# Patient Record
Sex: Female | Born: 1955 | Race: White | Hispanic: No | Marital: Married | Smoking: Never smoker
Health system: Southern US, Community
[De-identification: ages and names within clinical notes are randomized; demographics above are authoritative.]

## PROBLEM LIST (undated history)

## (undated) DIAGNOSIS — G8929 Other chronic pain: Secondary | ICD-10-CM

## (undated) DIAGNOSIS — R351 Nocturia: Secondary | ICD-10-CM

## (undated) DIAGNOSIS — F32A Depression, unspecified: Secondary | ICD-10-CM

## (undated) DIAGNOSIS — T8859XA Other complications of anesthesia, initial encounter: Secondary | ICD-10-CM

## (undated) DIAGNOSIS — R350 Frequency of micturition: Secondary | ICD-10-CM

## (undated) DIAGNOSIS — F329 Major depressive disorder, single episode, unspecified: Secondary | ICD-10-CM

## (undated) DIAGNOSIS — G4700 Insomnia, unspecified: Secondary | ICD-10-CM

## (undated) DIAGNOSIS — K219 Gastro-esophageal reflux disease without esophagitis: Secondary | ICD-10-CM

## (undated) DIAGNOSIS — IMO0002 Reserved for concepts with insufficient information to code with codable children: Secondary | ICD-10-CM

## (undated) DIAGNOSIS — M75101 Unspecified rotator cuff tear or rupture of right shoulder, not specified as traumatic: Secondary | ICD-10-CM

## (undated) DIAGNOSIS — R112 Nausea with vomiting, unspecified: Secondary | ICD-10-CM

## (undated) DIAGNOSIS — E1340 Other specified diabetes mellitus with diabetic neuropathy, unspecified: Secondary | ICD-10-CM

## (undated) DIAGNOSIS — E785 Hyperlipidemia, unspecified: Secondary | ICD-10-CM

## (undated) DIAGNOSIS — G40909 Epilepsy, unspecified, not intractable, without status epilepticus: Secondary | ICD-10-CM

## (undated) DIAGNOSIS — M2550 Pain in unspecified joint: Secondary | ICD-10-CM

## (undated) DIAGNOSIS — G5600 Carpal tunnel syndrome, unspecified upper limb: Secondary | ICD-10-CM

## (undated) DIAGNOSIS — K429 Umbilical hernia without obstruction or gangrene: Secondary | ICD-10-CM

## (undated) DIAGNOSIS — R51 Headache: Secondary | ICD-10-CM

## (undated) DIAGNOSIS — M549 Dorsalgia, unspecified: Secondary | ICD-10-CM

## (undated) DIAGNOSIS — M2540 Effusion, unspecified joint: Secondary | ICD-10-CM

## (undated) DIAGNOSIS — F419 Anxiety disorder, unspecified: Secondary | ICD-10-CM

## (undated) DIAGNOSIS — I10 Essential (primary) hypertension: Secondary | ICD-10-CM

## (undated) DIAGNOSIS — R0602 Shortness of breath: Secondary | ICD-10-CM

## (undated) DIAGNOSIS — B379 Candidiasis, unspecified: Secondary | ICD-10-CM

## (undated) DIAGNOSIS — Z9889 Other specified postprocedural states: Secondary | ICD-10-CM

## (undated) DIAGNOSIS — Z87440 Personal history of urinary (tract) infections: Secondary | ICD-10-CM

## (undated) DIAGNOSIS — M1712 Unilateral primary osteoarthritis, left knee: Secondary | ICD-10-CM

## (undated) DIAGNOSIS — E119 Type 2 diabetes mellitus without complications: Secondary | ICD-10-CM

## (undated) DIAGNOSIS — T4145XA Adverse effect of unspecified anesthetic, initial encounter: Secondary | ICD-10-CM

## (undated) HISTORY — DX: Other specified diabetes mellitus with diabetic neuropathy, unspecified: E13.40

## (undated) HISTORY — DX: Epilepsy, unspecified, not intractable, without status epilepticus: G40.909

## (undated) HISTORY — PX: JOINT REPLACEMENT: SHX530

## (undated) HISTORY — PX: TONSILLECTOMY: SUR1361

## (undated) HISTORY — PX: CHOLECYSTECTOMY: SHX55

## (undated) HISTORY — PX: BACK SURGERY: SHX140

## (undated) HISTORY — PX: ABDOMINAL HYSTERECTOMY: SHX81

## (undated) SURGERY — Surgical Case
Anesthesia: *Unknown

---

## 2011-05-04 ENCOUNTER — Other Ambulatory Visit: Payer: Self-pay | Admitting: Orthopedic Surgery

## 2011-05-05 ENCOUNTER — Other Ambulatory Visit: Payer: Self-pay | Admitting: Orthopedic Surgery

## 2011-05-06 ENCOUNTER — Encounter (HOSPITAL_BASED_OUTPATIENT_CLINIC_OR_DEPARTMENT_OTHER): Payer: Self-pay | Admitting: *Deleted

## 2011-05-06 NOTE — Progress Notes (Signed)
Pt lives in [place] Will need ekg-istat-

## 2011-05-09 ENCOUNTER — Encounter (HOSPITAL_BASED_OUTPATIENT_CLINIC_OR_DEPARTMENT_OTHER): Payer: Self-pay | Admitting: Anesthesiology

## 2011-05-09 ENCOUNTER — Ambulatory Visit (HOSPITAL_BASED_OUTPATIENT_CLINIC_OR_DEPARTMENT_OTHER)
Admission: RE | Admit: 2011-05-09 | Discharge: 2011-05-09 | Disposition: A | Discharge status: Home / Self Care | Payer: Medicare Other | Source: Ambulatory Visit | Attending: Orthopedic Surgery | Admitting: Orthopedic Surgery

## 2011-05-09 ENCOUNTER — Ambulatory Visit (HOSPITAL_BASED_OUTPATIENT_CLINIC_OR_DEPARTMENT_OTHER): Payer: Medicare Other | Admitting: Anesthesiology

## 2011-05-09 ENCOUNTER — Encounter (HOSPITAL_BASED_OUTPATIENT_CLINIC_OR_DEPARTMENT_OTHER)
Admission: RE | Disposition: A | Discharge status: Home / Self Care | Payer: Self-pay | Source: Ambulatory Visit | Attending: Orthopedic Surgery

## 2011-05-09 ENCOUNTER — Encounter (HOSPITAL_BASED_OUTPATIENT_CLINIC_OR_DEPARTMENT_OTHER): Payer: Self-pay

## 2011-05-09 ENCOUNTER — Encounter (HOSPITAL_BASED_OUTPATIENT_CLINIC_OR_DEPARTMENT_OTHER): Payer: Self-pay | Admitting: Orthopedic Surgery

## 2011-05-09 DIAGNOSIS — I10 Essential (primary) hypertension: Secondary | ICD-10-CM | POA: Insufficient documentation

## 2011-05-09 DIAGNOSIS — G5600 Carpal tunnel syndrome, unspecified upper limb: Secondary | ICD-10-CM | POA: Insufficient documentation

## 2011-05-09 DIAGNOSIS — E119 Type 2 diabetes mellitus without complications: Secondary | ICD-10-CM | POA: Insufficient documentation

## 2011-05-09 DIAGNOSIS — K219 Gastro-esophageal reflux disease without esophagitis: Secondary | ICD-10-CM | POA: Insufficient documentation

## 2011-05-09 HISTORY — DX: Adverse effect of unspecified anesthetic, initial encounter: T41.45XA

## 2011-05-09 HISTORY — DX: Reserved for concepts with insufficient information to code with codable children: IMO0002

## 2011-05-09 HISTORY — DX: Gastro-esophageal reflux disease without esophagitis: K21.9

## 2011-05-09 HISTORY — DX: Other chronic pain: G89.29

## 2011-05-09 HISTORY — DX: Anxiety disorder, unspecified: F41.9

## 2011-05-09 HISTORY — DX: Other complications of anesthesia, initial encounter: T88.59XA

## 2011-05-09 HISTORY — DX: Carpal tunnel syndrome, unspecified upper limb: G56.00

## 2011-05-09 HISTORY — DX: Depression, unspecified: F32.A

## 2011-05-09 HISTORY — DX: Other specified postprocedural states: Z98.890

## 2011-05-09 HISTORY — PX: CARPAL TUNNEL RELEASE: SHX101

## 2011-05-09 HISTORY — DX: Nausea with vomiting, unspecified: R11.2

## 2011-05-09 HISTORY — DX: Essential (primary) hypertension: I10

## 2011-05-09 HISTORY — DX: Major depressive disorder, single episode, unspecified: F32.9

## 2011-05-09 HISTORY — DX: Dorsalgia, unspecified: M54.9

## 2011-05-09 LAB — POCT I-STAT, CHEM 8
BUN: 13 mg/dL (ref 6–23)
Creatinine, Ser: 0.7 mg/dL (ref 0.50–1.10)
Glucose, Bld: 170 mg/dL — ABNORMAL HIGH (ref 70–99)
Hemoglobin: 13.9 g/dL (ref 12.0–15.0)
Sodium: 139 mEq/L (ref 135–145)
TCO2: 25 mmol/L (ref 0–100)

## 2011-05-09 SURGERY — CARPAL TUNNEL RELEASE
Anesthesia: Monitor Anesthesia Care | Site: Wrist | Laterality: Right | Wound class: Clean

## 2011-05-09 MED ORDER — METOCLOPRAMIDE HCL 5 MG/ML IJ SOLN
INTRAMUSCULAR | Status: DC | PRN
  Administered 2011-05-09: 10 mg via INTRAVENOUS

## 2011-05-09 MED ORDER — CEFAZOLIN SODIUM 1-5 GM-% IV SOLN
1.0000 g | INTRAVENOUS | Status: AC
  Administered 2011-05-09: 1 g via INTRAVENOUS

## 2011-05-09 MED ORDER — LACTATED RINGERS IV SOLN
INTRAVENOUS | Status: DC
  Administered 2011-05-09: 09:00:00 via INTRAVENOUS

## 2011-05-09 MED ORDER — MIDAZOLAM HCL 5 MG/5ML IJ SOLN
INTRAMUSCULAR | Status: DC | PRN
  Administered 2011-05-09 (×2): 1 mg via INTRAVENOUS

## 2011-05-09 MED ORDER — FENTANYL CITRATE 0.05 MG/ML IJ SOLN
INTRAMUSCULAR | Status: DC | PRN
  Administered 2011-05-09 (×2): 50 ug via INTRAVENOUS

## 2011-05-09 MED ORDER — CHLORHEXIDINE GLUCONATE 4 % EX LIQD: 60.0000 mL | Freq: Once | CUTANEOUS | Status: DC

## 2011-05-09 MED ORDER — PROPOFOL 10 MG/ML IV EMUL
INTRAVENOUS | Status: DC | PRN
  Administered 2011-05-09: 100 ug/kg/min via INTRAVENOUS

## 2011-05-09 MED ORDER — HYDROCODONE-ACETAMINOPHEN 5-325 MG PO TABS: ORAL_TABLET | ORAL | Status: DC

## 2011-05-09 MED ORDER — BUPIVACAINE HCL (PF) 0.25 % IJ SOLN
INTRAMUSCULAR | Status: DC | PRN
  Administered 2011-05-09: 10 mL

## 2011-05-09 MED ORDER — ONDANSETRON HCL 4 MG/2ML IJ SOLN
INTRAMUSCULAR | Status: DC | PRN
  Administered 2011-05-09: 4 mg via INTRAVENOUS

## 2011-05-09 SURGICAL SUPPLY — 33 items
BANDAGE ELASTIC 3 VELCRO ST LF (GAUZE/BANDAGES/DRESSINGS) ×2 IMPLANT
BANDAGE GAUZE ELAST BULKY 4 IN (GAUZE/BANDAGES/DRESSINGS) ×2 IMPLANT
BLADE MINI RND TIP GREEN BEAV (BLADE) IMPLANT
BLADE SURG 15 STRL LF DISP TIS (BLADE) ×2 IMPLANT
BLADE SURG 15 STRL SS (BLADE) ×2
BNDG ESMARK 4X9 LF (GAUZE/BANDAGES/DRESSINGS) IMPLANT
CHLORAPREP W/TINT 26ML (MISCELLANEOUS) ×2 IMPLANT
CLOTH BEACON ORANGE TIMEOUT ST (SAFETY) ×2 IMPLANT
CORDS BIPOLAR (ELECTRODE) ×2 IMPLANT
COVER MAYO STAND STRL (DRAPES) ×2 IMPLANT
COVER TABLE BACK 60X90 (DRAPES) ×2 IMPLANT
CUFF TOURNIQUET SINGLE 18IN (TOURNIQUET CUFF) ×2 IMPLANT
DRAPE EXTREMITY T 121X128X90 (DRAPE) ×2 IMPLANT
DRAPE SURG 17X23 STRL (DRAPES) ×2 IMPLANT
DRSG PAD ABDOMINAL 8X10 ST (GAUZE/BANDAGES/DRESSINGS) ×2 IMPLANT
GAUZE XEROFORM 1X8 LF (GAUZE/BANDAGES/DRESSINGS) ×2 IMPLANT
GLOVE BIO SURGEON STRL SZ 6.5 (GLOVE) ×2 IMPLANT
GLOVE BIO SURGEON STRL SZ7.5 (GLOVE) ×2 IMPLANT
GOWN PREVENTION PLUS XLARGE (GOWN DISPOSABLE) ×4 IMPLANT
GOWN STRL REIN XL XLG (GOWN DISPOSABLE) IMPLANT
NEEDLE HYPO 25X1 1.5 SAFETY (NEEDLE) ×2 IMPLANT
NS IRRIG 1000ML POUR BTL (IV SOLUTION) ×2 IMPLANT
PACK BASIN DAY SURGERY FS (CUSTOM PROCEDURE TRAY) ×2 IMPLANT
PADDING CAST ABS 4INX4YD NS (CAST SUPPLIES) ×1
PADDING CAST ABS COTTON 4X4 ST (CAST SUPPLIES) ×1 IMPLANT
SPONGE GAUZE 4X4 12PLY (GAUZE/BANDAGES/DRESSINGS) ×2 IMPLANT
STOCKINETTE 4X48 STRL (DRAPES) ×2 IMPLANT
SUT ETHILON 4 0 PS 2 18 (SUTURE) ×2 IMPLANT
SYR BULB 3OZ (MISCELLANEOUS) ×2 IMPLANT
SYR CONTROL 10ML LL (SYRINGE) ×2 IMPLANT
TOWEL OR 17X24 6PK STRL BLUE (TOWEL DISPOSABLE) ×4 IMPLANT
UNDERPAD 30X30 INCONTINENT (UNDERPADS AND DIAPERS) ×2 IMPLANT
WATER STERILE IRR 1000ML POUR (IV SOLUTION) ×2 IMPLANT

## 2011-05-09 NOTE — Brief Op Note (Signed)
05/09/2011  9:35 AM  PATIENT:  Suzy Bouchard  56 y.o. female  PRE-OPERATIVE DIAGNOSIS:  right carpal tunnel syndrome  POST-OPERATIVE DIAGNOSIS:  same  PROCEDURE:  Procedure(s) (LRB): CARPAL TUNNEL RELEASE (Right)  SURGEON:  Surgeon(s) and Role:    * Tami Ribas, MD - Primary  PHYSICIAN ASSISTANT:   ASSISTANTS: none   ANESTHESIA:   bier block  EBL:  Total I/O In: 100 [I.V.:100] Out: -   BLOOD ADMINISTERED:none  DRAINS: none   LOCAL MEDICATIONS USED:  MARCAINE     SPECIMEN:  No Specimen  DISPOSITION OF SPECIMEN:  N/A  COUNTS:  YES  TOURNIQUET:   Total Tourniquet Time Documented: Forearm (Right) - 31 minutes  DICTATION: .Other Dictation: Dictation Number [id number]  PLAN OF CARE: Discharge to home after PACU  PATIENT DISPOSITION:  PACU - hemodynamically stable.

## 2011-05-09 NOTE — Op Note (Signed)
Michelle Crane, Michelle Crane               ACCOUNT NO.:  [id number]  MEDICAL RECORD NO.:  [id number]  LOCATION:                                 FACILITY:  PHYSICIAN:  Betha Loa, MD             DATE OF BIRTH:  DATE OF PROCEDURE:  05/09/2011 DATE OF DISCHARGE:                              OPERATIVE REPORT   PREOPERATIVE DIAGNOSIS:  Right carpal tunnel syndrome.  POSTOPERATIVE DIAGNOSIS:  Right carpal tunnel syndrome.  PROCEDURE:  Right carpal tunnel release.  SURGEON:  Betha Loa, MD  ASSISTANTS:  None.  ANESTHESIA:  Bier block.  INTRAVENOUS FLUIDS:  Per anesthesia flow sheet.  ESTIMATED BLOOD LOSS:  Minimal.  COMPLICATIONS:  None.  SPECIMENS:  None.  TOURNIQUET TIME:  31 minutes.  DISPOSITION:  Stable to PACU.  INDICATIONS:  Ms. Negron is a 56 year old female who has had carpal tunnel syndrome in bilateral upper extremities.  She has positive nerve conduction studies.  She has positive Tinel's and Phalen's test of median nerve over the carpal tunnel.  It wakes her at night.  She wishes to have a release of transverse carpal ligament to manage the problem. Risks, benefits, and alternatives of surgery were discussed including risk of blood loss, infection, damage to nerves, vessels, tendons, ligaments, and bone, failure of surgery, need for additional surgery, complications with wound healing, continued pain, continued carpal tunnel syndrome.  She wished understanding these risks and elected to proceed.  OPERATIVE COURSE:  After being identified preoperatively by myself, the Patient and I agreed upon procedure and site of procedure.  Surgical site was marked.  The risks, benefits, and alternatives of surgery were reviewed and she wished to proceed.  Surgical consent had been signed. She was given 1 g of IV Ancef as preoperative antibiotic prophylaxis. She was transferred to the operating room and placed on the operating table in supine position with right upper  extremity on an arm board. Bier block anesthesia was induced by anesthesiologist.  The right upper extremity was prepped and draped in a normal sterile orthopedic fashion. A surgical pause was performed between surgeons, anesthesia, and operating staff and all were agreement as to the patient procedure and site of procedure.  An incision was made centered over the transverse carpal ligament.  This was carried into the subcutaneous tissues by spreading technique.  The transverse carpal ligament was incised sharply with a knife.  Care was taken to ensure complete release of the transverse carpal ligament distally.  It was then incised in a proximal direction.  The distal aspect of the volar antebrachial fascia was split as well.  Finger was placed in the wound to ensure complete release of the transverse carpal ligament which was the case.  The nerve was inspected.  It was flattened.  The motor branch was identified and was intact.  The wound was copiously irrigated with sterile saline.  Skin was closed with 4-0 nylon in a horizontal mattress fashion.  The wound was injected with 10 mL of 0.25% plain Marcaine to aid in postoperative analgesia.  It was then dressed with sterile Xeroform, 4 x 4's, and ABD and wrapped with Kerlix  and Ace bandage.  Tourniquet was deflated at 31 minutes.  The fingertips were pink with brisk capillary refill after deflation of tourniquet.  Operative drapes were broken down and the patient was awoken from anesthesia safely.  She was transferred back to stretcher and taken to PACU in stable condition.  I will see her back in the office in 1 week for postoperative followup.  I will give her Norco 5/325 one to two p.o. q.6 hours p.r.n. pain, dispensed #40.     Betha Loa, MD     KK/MEDQ  D:  05/09/2011  T:  05/09/2011  Job:  [id number]

## 2011-05-09 NOTE — H&P (Signed)
Michelle Crane is an 56 y.o. female.   Chief Complaint: carpal tunnel syndrome HPI: 56 yo rhd female with bilateral carpal tunnel syndrome.  Nocturnal symptoms every night.  Positive nerve conduction studies.  She wishes to have carpal tunnel release.  Past Medical History  Diagnosis Date  . Complication of anesthesia   . PONV (postoperative nausea and vomiting)   . DDD (degenerative disc disease)   . Diabetes mellitus   . Hypertension   . Anxiety   . Depression   . Chronic back pain   . GERD (gastroesophageal reflux disease)   . Carpal tunnel syndrome     Past Surgical History  Procedure Date  . Tonsillectomy   . Cholecystectomy   . Abdominal hysterectomy   . Back surgery [id number]    lumb  . Joint replacement 2011    rt total knee    History reviewed. No pertinent family history. Social History:  reports that she has never smoked. She does not have any smokeless tobacco history on file. She reports that she does not drink alcohol or use illicit drugs.  Allergies: No Known Allergies  Medications Prior to Admission  Medication Dose Route Frequency Provider Last Rate Last Dose  . ceFAZolin (ANCEF) IVPB 1 g/50 mL premix  1 g Intravenous 60 min Pre-Op Tami Ribas, MD      . chlorhexidine (HIBICLENS) 4 % liquid 4 application  60 mL Topical Once Tami Ribas, MD      . lactated ringers infusion   Intravenous Continuous Hart Robinsons, MD       Medications Prior to Admission  Medication Sig Dispense Refill  . ALPRAZolam (XANAX) 1 MG tablet Take 1 mg by mouth 3 (three) times daily as needed.      Marland Kitchen glimepiride (AMARYL) 2 MG tablet Take 2 mg by mouth daily before breakfast.      . HYDROcodone-acetaminophen (NORCO) 10-325 MG per tablet Take 1 tablet by mouth every 6 (six) hours as needed.      . metoprolol succinate (TOPROL-XL) 50 MG 24 hr tablet Take 50 mg by mouth daily. Take with or immediately following a meal.        Results for orders placed during the hospital  encounter of 05/09/11 (from the past 48 hour(s))  POCT I-STAT, CHEM 8     Status: Abnormal   Collection Time   05/09/11  8:29 AM      Component Value Range Comment   Sodium 139  135 - 145 (mEq/L)    Potassium 3.9  3.5 - 5.1 (mEq/L)    Chloride 106  96 - 112 (mEq/L)    BUN 13  6 - 23 (mg/dL)    Creatinine, Ser 5.62  0.50 - 1.10 (mg/dL)    Glucose, Bld 130 (*) 70 - 99 (mg/dL)    Calcium, Ion 8.65 (*) 1.12 - 1.32 (mmol/L)    TCO2 25  0 - 100 (mmol/L)    Hemoglobin 13.9  12.0 - 15.0 (g/dL)    HCT 78.4  69.6 - 29.5 (%)     No results found.   A comprehensive review of systems was negative except for: Eyes: positive for contacts/glasses Neurological: positive for gait problems Behavioral/Psych: positive for depression  Blood pressure 153/93, pulse 83, temperature 98.1 F (36.7 C), temperature source Oral, resp. rate 20, height 5\' 1"  (1.549 m), weight 74.844 kg (165 lb), SpO2 97.00%.  General appearance: alert, cooperative and appears stated age Head: Normocephalic, without obvious abnormality, atraumatic Neck: supple,  symmetrical, trachea midline Resp: clear to auscultation bilaterally Cardio: regular rate and rhythm GI: soft, non-tender; bowel sounds normal; no masses,  no organomegaly Extremities: light touch sensation and capillary refill intact all digits.  +epl/fpl/io.  positive tinels, phalens, durkins at median nerve at carpal tunnel. Pulses: 2+ and symmetric Skin: Skin color, texture, turgor normal. No rashes or lesions Neurologic: Grossly normal Incision/Wound: na  Assessment/Plan Bilateral carpal tunnel syndrome.  Discussed operative and non operative treatment options.  Patient wishes to have operative treatment.  Risks, benefits, and alternatives of surgery were discussed and the patient agrees with the plan of care.   Michelle Crane R 05/09/2011, 8:40 AM

## 2011-05-09 NOTE — Discharge Instructions (Addendum)

## 2011-05-09 NOTE — Transfer of Care (Signed)
Immediate Anesthesia Transfer of Care Note  Patient: Michelle Crane  Procedure(s) Performed: Procedure(s) (LRB): CARPAL TUNNEL RELEASE (Right)  Patient Location: PACU  Anesthesia Type: MAC and Bier block  Level of Consciousness: awake and alert   Airway & Oxygen Therapy: Patient Spontanous Breathing and Patient connected to face mask oxygen  Post-op Assessment: Report given to PACU RN and Post -op Vital signs reviewed and stable  Post vital signs: Reviewed and stable  Complications: No apparent anesthesia complications

## 2011-05-09 NOTE — Anesthesia Preprocedure Evaluation (Signed)
Anesthesia Evaluation  Patient identified by MRN, date of birth, ID band Patient awake    Reviewed: Allergy & Precautions, H&P , NPO status , Patient's Chart, lab work & pertinent test results, reviewed documented beta blocker date and time   History of Anesthesia Complications (+) PONVNegative for: history of anesthetic complications  Airway Mallampati: II TM Distance: >3 FB Neck ROM: Full    Dental No notable dental hx. (+) Dental Advisory Given and Teeth Intact   Pulmonary neg pulmonary ROS,  breath sounds clear to auscultation  Pulmonary exam normal       Cardiovascular hypertension, Pt. on medications and Pt. on home beta blockers negative cardio ROS  Rhythm:Regular Rate:Normal     Neuro/Psych negative neurological ROS     GI/Hepatic Neg liver ROS, GERD-  Controlled,  Endo/Other  Diabetes mellitus-, Type 2, Oral Hypoglycemic AgentsMorbid obesity  Renal/GU negative Renal ROS     Musculoskeletal   Abdominal (+) + obese,   Peds  Hematology   Anesthesia Other Findings   Reproductive/Obstetrics                           Anesthesia Physical Anesthesia Plan  ASA: II  Anesthesia Plan: Bier Block and MAC   Post-op Pain Management:    Induction:   Airway Management Planned: Simple Face Mask  Additional Equipment:   Intra-op Plan:   Post-operative Plan:   Informed Consent: I have reviewed the patients History and Physical, chart, labs and discussed the procedure including the risks, benefits and alternatives for the proposed anesthesia with the patient or authorized representative who has indicated his/her understanding and acceptance.   Dental advisory given  Plan Discussed with: CRNA and Surgeon  Anesthesia Plan Comments: (Plan routine monitors, IV regional Lidocaine)        Anesthesia Quick Evaluation

## 2011-05-09 NOTE — Anesthesia Postprocedure Evaluation (Signed)
  Anesthesia Post-op Note  Patient: Michelle Crane  Procedure(s) Performed: Procedure(s) (LRB): CARPAL TUNNEL RELEASE (Right)  Patient Location: PACU  Anesthesia Type: Bier block  Level of Consciousness: awake, alert  and oriented  Airway and Oxygen Therapy: Patient Spontanous Breathing  Post-op Pain: none  Post-op Assessment: Post-op Vital signs reviewed, Patient's Cardiovascular Status Stable, Respiratory Function Stable, Patent Airway, No signs of Nausea or vomiting and Pain level controlled  Post-op Vital Signs: Reviewed and stable  Complications: No apparent anesthesia complications

## 2011-05-09 NOTE — Op Note (Signed)
Dictation [id number]

## 2011-05-10 ENCOUNTER — Encounter (HOSPITAL_BASED_OUTPATIENT_CLINIC_OR_DEPARTMENT_OTHER): Payer: Self-pay | Admitting: Orthopedic Surgery

## 2011-11-07 ENCOUNTER — Other Ambulatory Visit: Payer: Self-pay | Admitting: Neurosurgery

## 2011-11-07 DIAGNOSIS — M4120 Other idiopathic scoliosis, site unspecified: Secondary | ICD-10-CM

## 2011-11-28 ENCOUNTER — Ambulatory Visit
Admission: RE | Admit: 2011-11-28 | Discharge: 2011-11-28 | Disposition: A | Discharge status: Home / Self Care | Payer: Medicare Other | Source: Ambulatory Visit | Attending: Neurosurgery | Admitting: Neurosurgery

## 2011-11-28 DIAGNOSIS — M4120 Other idiopathic scoliosis, site unspecified: Secondary | ICD-10-CM

## 2011-11-28 MED ORDER — GADOBENATE DIMEGLUMINE 529 MG/ML IV SOLN
15.0000 mL | Freq: Once | INTRAVENOUS | Status: AC | PRN
  Administered 2011-11-28: 15 mL via INTRAVENOUS

## 2011-11-29 ENCOUNTER — Other Ambulatory Visit: Payer: Self-pay | Admitting: Neurosurgery

## 2011-12-14 ENCOUNTER — Encounter (HOSPITAL_COMMUNITY): Payer: Self-pay | Admitting: Pharmacy Technician

## 2011-12-19 ENCOUNTER — Encounter (HOSPITAL_COMMUNITY): Payer: Self-pay

## 2011-12-19 ENCOUNTER — Encounter (HOSPITAL_COMMUNITY)
Admission: RE | Admit: 2011-12-19 | Discharge: 2011-12-19 | Disposition: A | Discharge status: Home / Self Care | Payer: Medicare Other | Source: Ambulatory Visit | Attending: Neurosurgery | Admitting: Neurosurgery

## 2011-12-19 ENCOUNTER — Ambulatory Visit (HOSPITAL_COMMUNITY)
Admission: RE | Admit: 2011-12-19 | Discharge: 2011-12-19 | Disposition: A | Discharge status: Home / Self Care | Payer: Medicare Other | Source: Ambulatory Visit | Attending: Anesthesiology | Admitting: Anesthesiology

## 2011-12-19 DIAGNOSIS — M519 Unspecified thoracic, thoracolumbar and lumbosacral intervertebral disc disorder: Secondary | ICD-10-CM | POA: Insufficient documentation

## 2011-12-19 DIAGNOSIS — Z01811 Encounter for preprocedural respiratory examination: Secondary | ICD-10-CM | POA: Insufficient documentation

## 2011-12-19 HISTORY — DX: Frequency of micturition: R35.0

## 2011-12-19 HISTORY — DX: Personal history of urinary (tract) infections: Z87.440

## 2011-12-19 HISTORY — DX: Nocturia: R35.1

## 2011-12-19 HISTORY — DX: Pain in unspecified joint: M25.50

## 2011-12-19 HISTORY — DX: Candidiasis, unspecified: B37.9

## 2011-12-19 HISTORY — DX: Insomnia, unspecified: G47.00

## 2011-12-19 HISTORY — DX: Umbilical hernia without obstruction or gangrene: K42.9

## 2011-12-19 HISTORY — DX: Headache: R51

## 2011-12-19 HISTORY — DX: Shortness of breath: R06.02

## 2011-12-19 HISTORY — DX: Effusion, unspecified joint: M25.40

## 2011-12-19 HISTORY — DX: Hyperlipidemia, unspecified: E78.5

## 2011-12-19 LAB — CBC
Hemoglobin: 14.9 g/dL (ref 12.0–15.0)
MCH: 30 pg (ref 26.0–34.0)
MCHC: 33.5 g/dL (ref 30.0–36.0)
MCV: 89.5 fL (ref 78.0–100.0)
Platelets: 205 10*3/uL (ref 150–400)
RBC: 4.97 MIL/uL (ref 3.87–5.11)

## 2011-12-19 LAB — TYPE AND SCREEN
ABO/RH(D): A POS
Antibody Screen: NEGATIVE

## 2011-12-19 LAB — BASIC METABOLIC PANEL
BUN: 20 mg/dL (ref 6–23)
CO2: 30 mEq/L (ref 19–32)
Calcium: 9.8 mg/dL (ref 8.4–10.5)
Glucose, Bld: 110 mg/dL — ABNORMAL HIGH (ref 70–99)
Sodium: 139 mEq/L (ref 135–145)

## 2011-12-19 NOTE — Progress Notes (Signed)
Pt doesn't have a cardiologist  Denies ever having an echo/stress test/heart cath  Medical MD-Dr.Caroline Seep in [place] at [facility]   Denies ekg or cxr being done within the past yr

## 2011-12-19 NOTE — Pre-Procedure Instructions (Signed)
20 Mylisa Zimny  12/19/2011   Your procedure is scheduled on:  Tues, Dec 3 @ 7:30 AM  Report to [facility] at 5:30 AM.  Call this number if you have problems the morning of surgery: [phone]   Remember:   Do not eat food:After Midnight.    Take these medicines the morning of surgery with A SIP OF WATER: Xanax(Alprazolam) and Metoprolol(Lopressor)   Do not wear jewelry, make-up or nail polish.  Do not wear lotions, powders, or perfumes. You may wear deodorant.  Do not shave 48 hours prior to surgery.   Do not bring valuables to the hospital.  Contacts, dentures or bridgework may not be worn into surgery.  Leave suitcase in the car. After surgery it may be brought to your room.  For patients admitted to the hospital, checkout time is 11:00 AM the day of discharge.   Patients discharged the day of surgery will not be allowed to drive home.    Special Instructions: Shower using CHG 2 nights before surgery and the night before surgery.  If you shower the day of surgery use CHG.  Use special wash - you have one bottle of CHG for all showers.  You should use approximately 1/3 of the bottle for each shower.   Please read over the following fact sheets that you were given: Pain Booklet, Coughing and Deep Breathing, Blood Transfusion Information, MRSA Information and Surgical Site Infection Prevention

## 2011-12-26 MED ORDER — CEFAZOLIN SODIUM-DEXTROSE 2-3 GM-% IV SOLR
2.0000 g | INTRAVENOUS | Status: AC
  Administered 2011-12-27: 2 g via INTRAVENOUS
  Filled 2011-12-26: qty 50

## 2011-12-26 NOTE — H&P (Signed)
Michelle Crane #[id number] DOB: [birth year]   11/28/2011:  Michelle Crane comes back today to review her MRI of her lumbar spine and this shows that she has scoliosis, spondylolisthesis, and severe spinal stenosis affecting L2-3, L3-4, L4-5, and L5-S1 levels.  She has had progression of scoliosis and spondylosis with foraminal stenosis compared to a previous MRI.    In light of these findings and her pain, I have recommended she undergo surgery and she wishes to do so, and this will be scheduled for 12/27/2011.  It will consist of a re-do decompression L2 through S1 levels with fusion. She was fitted for an LSO brace. We went over in great detail the risks and benefits of surgery and she wishes to proceed.   I went over the surgical procedure with the patient in great detail today.  I went over the diagnostic studies, as well as surgical models.  We discussed the potential risks and benefits of the surgery, as well as expected hospital course.  The patient would generally wear a brace for three months after surgery.  The risks include, but are not limited to, bleeding, possible need for transfusion, infection, damage to nerves and vessels, risks of anesthesia, dural tear, injury to lumbar nerve roots causing either temporary or permanent leg pain, numbness or weakness, malpositioning of instrumentation, fusion failure, failure to relieve pain, back pain after surgery, recurrent disc herniation, worsening of pain and adjacent degeneration after a spinal fusion.  Also, the potential for the need for further surgery in the event of incomplete fusion.  We also discussed the risks of injury to abdominal structures including bowel, iliac artery or vein injury.           Michelle Crane. Venetia Maxon, M.D./[place]   NEUROSURGICAL CONSULTATION  Michelle Crane  #[id number] DOB:  09-13-1955  November 07, 2011  HISTORY:     Michelle Crane is a 56 year old disabled woman with back and leg pain who complains of pain both of her legs into the  feet and both buttocks.  She describes it 5/10 in severity at its best and 10/10 at its worse.  She notes numbness to both legs.  She says this has been doing on for the last three years.  She notes spasms into both legs.  She says she has to lie down to get some relief.  She has had injections by Dr. Rhea Pink without significant help in 2011.  She has been taking Hydrocodone 10/325 four times daily, Trazodone 50 mg. 2 p.o. q.h.s.    She saw Dr. Eulah Pont in [place] last year and surgery was planned but the physician unexpected left the practice.  She says she is using a walker and cannot stand long.  She has pain in both legs and down into the legs.    REVIEW OF SYSTEMS:   A detailed Review of Systems sheet was reviewed with the patient.  Pertinent positives include: Constitutional-night sweats; Eyes-wears glasses, ENTM-balance disturbance; Cardiovascular-high blood pressure, high cholesterol, leg pain while walking; Musculoskeletal-arm weakness leg weakness, back pain, leg pain, joint pain or swelling, arthritis; Neurological-trouble with coordination of legs; Psychiatric-anxiety; Endocrine-type II diabetes, hormone problems. All other systems are negative; this includes Respiratory, Gastrointestinal, Genitourinary, Integumentary & Breast, Hematologic/Lymphatic, and Allergic/Immunologic.    PAST MEDICAL HISTORY:      Current Medical Conditions:    She has a history of hypertension, type II diabetes, and elevated cholesterol.       Prior Operations and Hospitalizations:   She has had  previous surgery by Dr. Krista Blue in 1992 and 1995 both for herniated lumbar discs.  She has had right total knee replacement in 2011, left carpal tunnel release in 04/2011 and bladder tack.      Medications and Allergies:   Hydrocodone 10/325 q.i.d., Metoprolol 50 mg. q.d., Alprazolam 1 mg. t.i.d., Glimepiride 4 mg. b.i.d. and Trazodone 150 mg. up to 3 tabs at bedtime.      Height and Weight:     5'1" tall, and 164  pounds.  BMI is 31.0.    SOCIAL HISTORY:    She denies tobacco, alcohol or drug use.    DIAGNOSTIC STUDIES:   X-rays were obtained in the office today and demonstrate levoconvexed scoliosis of the lumbar spine with mobile spondylolisthesis of L4 on L5.  On neutral alignment, the degree of listhesis is 13 mm which increases to 15 mm on flexion and decreases to 10.8 mm on extension.   MRI of the lumbar spine 12/04/2008 was reviewed with the patient in the office today which demonstrates mild scoliosis and grade I anterolisthesis of L4 on L5 with disc protrusions at L3-4, 4-5 and S5-S1 levels with foraminal stenosis right greater than left at L4-5 and L5-S1 level sand left greater than right at L3-4 level.  There is the high likelihood of encroachment of the nerve particularly L5 nerve roots at L5-S1.    PHYSICAL EXAMINATION:      General Appearance:   Michelle Crane is a pleasant and cooperative and in no acute distress.      Blood Pressure, Pulse:     156/96, heart rate 80 and regular, and respirations 16.    HEENT - normocephalic, atraumatic.  The pupils are equal, round and reactive to light.  The extraocular muscles are intact.  Sclerae - white.  Conjunctiva - pink.  Oropharynx benign.  Uvula midline.     Neck - there are no masses, meningismus, deformities, tracheal deviation, jugular vein distention or carotid bruits.  There is normal cervical range of motion.  Spurlings' test is negative without reproducible radicular pain turning the patient's head to either side.  Lhermitte's sign is not present with axial compression.      Respiratory - there is normal respiratory effort with good intercostal function.  Lungs are clear to auscultation.  There are no rales, rhonchi or wheezes.      Cardiovascular - the heart has regular rate and rhythm to auscultation.  No murmurs are appreciated.  There is no extremity edema, cyanosis or clubbing.  There are palpable pedal pulses.      Abdomen - obese,  soft, nontender, no hepatosplenomegaly appreciated or masses.  There are active bowel sounds.  No guarding or rebound.      Musculoskeletal Examination - she walks with a flexed attitude and is hobbling secondary to left knee arthritis, and bilateral lower extremity pain.  She says she cannot stand for long.  She has pain in both buttocks to both legs.  She has a positive straight leg raise bilaterally at 45 degrees.    NEUROLOGICAL EXAMINATION:  The patient is oriented to time, person and place and has good recall of both recent and remote memory with normal attention span and concentration.  The patient speaks with clear and fluent speech and exhibits normal language function and appropriate fund of knowledge.      Cranial Nerve Examination - pupils are equal, round and reactive to light.  Extraocular movements are full.  Visual fields are full to confrontational  testing.  Facial sensation and facial movement are  symmetric and intact.  Hearing is intact to finger rub.  Palate is upgoing.  Shoulder shrug is symmetric.  Tongue protrudes in the midline.      Motor Examination - motor strength is 5/5 in the bilateral deltoids, biceps, triceps, handgrips, wrist extensors, interosseous.  In the lower extremities motor strength is 5/5 in hip flexion, extension, quadriceps, hamstrings, plantar flexion, dorsiflexion and extensor hallucis longus.      Sensory Examination - normal to light touch and pinprick sensation in the upper and lower extremities with the exception of decreased pin sensation in the left L5 and L4 distributions.     Deep Tendon Reflexes - 2 in the biceps, triceps, and brachioradialis, 2 in the knees, 2 in the ankles.  The great toes are downgoing to plantar stimulation.  No pathologic reflexes.       Cerebellar Examination - normal coordination in upper and lower extremities and normal rapid alternating movements.  Romberg test is negative.    IMPRESSION AND RECOMMENDATIONS: Liadan Guizar is a 56 year old woman with low back and bilateral lower extremity pain.  She has significant spondylolisthesis of L4 on L5 with multilevel scoliosis and foraminal stenosis.  She has not had any recent imaging studies of her lumbar spine.  I would like for her to get an MRI of her lumbar spine and I will make further recommendations after that study has been doing.  I do think that she will likely require surgical intervention given the severity of her complaint and the longstanding nature thereof.    [facility]    Michelle Crane. Venetia Maxon, M.D.

## 2011-12-27 ENCOUNTER — Encounter (HOSPITAL_COMMUNITY): Payer: Self-pay | Admitting: *Deleted

## 2011-12-27 ENCOUNTER — Encounter (HOSPITAL_COMMUNITY)
Admission: RE | Disposition: A | Discharge status: Home / Self Care | Payer: Self-pay | Source: Ambulatory Visit | Attending: Neurosurgery

## 2011-12-27 ENCOUNTER — Inpatient Hospital Stay (HOSPITAL_COMMUNITY): Payer: Medicare Other

## 2011-12-27 ENCOUNTER — Encounter (HOSPITAL_COMMUNITY): Payer: Self-pay | Admitting: Anesthesiology

## 2011-12-27 ENCOUNTER — Inpatient Hospital Stay (HOSPITAL_COMMUNITY)
Admission: RE | Admit: 2011-12-27 | Discharge: 2011-12-29 | DRG: 458 | Disposition: A | Discharge status: Home / Self Care | Payer: Medicare Other | Source: Ambulatory Visit | Attending: Neurosurgery | Admitting: Neurosurgery

## 2011-12-27 ENCOUNTER — Inpatient Hospital Stay (HOSPITAL_COMMUNITY): Payer: Medicare Other | Admitting: Anesthesiology

## 2011-12-27 DIAGNOSIS — F411 Generalized anxiety disorder: Secondary | ICD-10-CM | POA: Diagnosis present

## 2011-12-27 DIAGNOSIS — I10 Essential (primary) hypertension: Secondary | ICD-10-CM | POA: Diagnosis present

## 2011-12-27 DIAGNOSIS — E119 Type 2 diabetes mellitus without complications: Secondary | ICD-10-CM | POA: Diagnosis present

## 2011-12-27 DIAGNOSIS — M4120 Other idiopathic scoliosis, site unspecified: Principal | ICD-10-CM | POA: Diagnosis present

## 2011-12-27 DIAGNOSIS — M5126 Other intervertebral disc displacement, lumbar region: Secondary | ICD-10-CM | POA: Diagnosis present

## 2011-12-27 DIAGNOSIS — Q762 Congenital spondylolisthesis: Secondary | ICD-10-CM

## 2011-12-27 DIAGNOSIS — M47817 Spondylosis without myelopathy or radiculopathy, lumbosacral region: Secondary | ICD-10-CM | POA: Diagnosis present

## 2011-12-27 DIAGNOSIS — Z96659 Presence of unspecified artificial knee joint: Secondary | ICD-10-CM

## 2011-12-27 DIAGNOSIS — Z79899 Other long term (current) drug therapy: Secondary | ICD-10-CM

## 2011-12-27 DIAGNOSIS — Z23 Encounter for immunization: Secondary | ICD-10-CM

## 2011-12-27 DIAGNOSIS — E7800 Pure hypercholesterolemia, unspecified: Secondary | ICD-10-CM | POA: Diagnosis present

## 2011-12-27 HISTORY — PX: POSTERIOR LUMBAR FUSION 4 LEVEL: SHX6037

## 2011-12-27 LAB — GLUCOSE, CAPILLARY
Glucose-Capillary: 121 mg/dL — ABNORMAL HIGH (ref 70–99)
Glucose-Capillary: 136 mg/dL — ABNORMAL HIGH (ref 70–99)

## 2011-12-27 SURGERY — POSTERIOR LUMBAR FUSION 4 LEVEL
Anesthesia: General | Site: Back | Laterality: Bilateral | Wound class: Clean

## 2011-12-27 MED ORDER — PHENYLEPHRINE HCL 10 MG/ML IJ SOLN
INTRAMUSCULAR | Status: DC | PRN
  Administered 2011-12-27 (×2): 40 ug via INTRAVENOUS

## 2011-12-27 MED ORDER — MEPERIDINE HCL 25 MG/ML IJ SOLN: 6.2500 mg | INTRAMUSCULAR | Status: DC | PRN

## 2011-12-27 MED ORDER — HYDROCODONE-ACETAMINOPHEN 5-325 MG PO TABS
1.0000 | ORAL_TABLET | ORAL | Status: DC | PRN
  Administered 2011-12-28: 2 via ORAL
  Administered 2011-12-28: 1 via ORAL
  Administered 2011-12-28 – 2011-12-29 (×3): 2 via ORAL
  Filled 2011-12-27 (×4): qty 2
  Filled 2011-12-27: qty 1

## 2011-12-27 MED ORDER — OXYCODONE-ACETAMINOPHEN 5-325 MG PO TABS
1.0000 | ORAL_TABLET | ORAL | Status: DC | PRN
  Administered 2011-12-28: 1 via ORAL
  Administered 2011-12-28 – 2011-12-29 (×3): 2 via ORAL
  Filled 2011-12-27: qty 1
  Filled 2011-12-27 (×3): qty 2
  Filled 2011-12-27: qty 1
  Filled 2011-12-27: qty 2

## 2011-12-27 MED ORDER — BUPIVACAINE HCL (PF) 0.5 % IJ SOLN
INTRAMUSCULAR | Status: DC | PRN
  Administered 2011-12-27: 7.5 mL

## 2011-12-27 MED ORDER — LIDOCAINE HCL (CARDIAC) 20 MG/ML IV SOLN
INTRAVENOUS | Status: DC | PRN
  Administered 2011-12-27: 80 mg via INTRAVENOUS

## 2011-12-27 MED ORDER — SODIUM CHLORIDE 0.9 % IJ SOLN: 9.0000 mL | INTRAMUSCULAR | Status: DC | PRN

## 2011-12-27 MED ORDER — ACETAMINOPHEN 325 MG PO TABS: 650.0000 mg | ORAL_TABLET | ORAL | Status: DC | PRN

## 2011-12-27 MED ORDER — 0.9 % SODIUM CHLORIDE (POUR BTL) OPTIME
TOPICAL | Status: DC | PRN
  Administered 2011-12-27 (×2): 1000 mL

## 2011-12-27 MED ORDER — MORPHINE SULFATE (PF) 1 MG/ML IV SOLN
INTRAVENOUS | Status: AC
  Filled 2011-12-27: qty 25

## 2011-12-27 MED ORDER — SODIUM CHLORIDE 0.9 % IJ SOLN
3.0000 mL | Freq: Two times a day (BID) | INTRAMUSCULAR | Status: DC
  Administered 2011-12-28 – 2011-12-29 (×2): 3 mL via INTRAVENOUS

## 2011-12-27 MED ORDER — HYDROMORPHONE HCL PF 1 MG/ML IJ SOLN
INTRAMUSCULAR | Status: AC
  Administered 2011-12-27: 0.5 mg via INTRAVENOUS
  Filled 2011-12-27: qty 1

## 2011-12-27 MED ORDER — PROMETHAZINE HCL 25 MG/ML IJ SOLN: 6.2500 mg | INTRAMUSCULAR | Status: DC | PRN

## 2011-12-27 MED ORDER — NALOXONE HCL 0.4 MG/ML IJ SOLN: 0.4000 mg | INTRAMUSCULAR | Status: DC | PRN

## 2011-12-27 MED ORDER — SENNA 8.6 MG PO TABS
1.0000 | ORAL_TABLET | Freq: Two times a day (BID) | ORAL | Status: DC
  Administered 2011-12-27 – 2011-12-29 (×4): 8.6 mg via ORAL
  Filled 2011-12-27 (×5): qty 1

## 2011-12-27 MED ORDER — MIDAZOLAM HCL 5 MG/5ML IJ SOLN
INTRAMUSCULAR | Status: DC | PRN
  Administered 2011-12-27: 2 mg via INTRAVENOUS

## 2011-12-27 MED ORDER — PHENOL 1.4 % MT LIQD: 1.0000 | OROMUCOSAL | Status: DC | PRN

## 2011-12-27 MED ORDER — CEFAZOLIN SODIUM 1-5 GM-% IV SOLN
1.0000 g | Freq: Three times a day (TID) | INTRAVENOUS | Status: AC
  Administered 2011-12-27 – 2011-12-28 (×2): 1 g via INTRAVENOUS
  Filled 2011-12-27 (×2): qty 50

## 2011-12-27 MED ORDER — SODIUM CHLORIDE 0.9 % IJ SOLN: 3.0000 mL | INTRAMUSCULAR | Status: DC | PRN

## 2011-12-27 MED ORDER — FENTANYL CITRATE 0.05 MG/ML IJ SOLN
INTRAMUSCULAR | Status: DC | PRN
  Administered 2011-12-27 (×3): 50 ug via INTRAVENOUS
  Administered 2011-12-27: 100 ug via INTRAVENOUS

## 2011-12-27 MED ORDER — INSULIN ASPART 100 UNIT/ML ~~LOC~~ SOLN
0.0000 [IU] | Freq: Three times a day (TID) | SUBCUTANEOUS | Status: DC
  Administered 2011-12-28: 3 [IU] via SUBCUTANEOUS
  Administered 2011-12-28: 2 [IU] via SUBCUTANEOUS

## 2011-12-27 MED ORDER — METHOCARBAMOL 100 MG/ML IJ SOLN
500.0000 mg | Freq: Four times a day (QID) | INTRAVENOUS | Status: DC | PRN
  Administered 2011-12-27: 500 mg via INTRAVENOUS
  Filled 2011-12-27: qty 5

## 2011-12-27 MED ORDER — THROMBIN 20000 UNITS EX SOLR
CUTANEOUS | Status: DC | PRN
  Administered 2011-12-27: 08:00:00 via TOPICAL

## 2011-12-27 MED ORDER — DIPHENHYDRAMINE HCL 50 MG/ML IJ SOLN: 12.5000 mg | Freq: Four times a day (QID) | INTRAMUSCULAR | Status: DC | PRN

## 2011-12-27 MED ORDER — PNEUMOCOCCAL VAC POLYVALENT 25 MCG/0.5ML IJ INJ
0.5000 mL | INJECTION | INTRAMUSCULAR | Status: AC
  Administered 2011-12-28: 0.5 mL via INTRAMUSCULAR
  Filled 2011-12-27: qty 0.5

## 2011-12-27 MED ORDER — ACETAMINOPHEN 650 MG RE SUPP: 650.0000 mg | RECTAL | Status: DC | PRN

## 2011-12-27 MED ORDER — ALPRAZOLAM 0.5 MG PO TABS
1.0000 mg | ORAL_TABLET | Freq: Three times a day (TID) | ORAL | Status: DC | PRN
  Administered 2011-12-28: 1 mg via ORAL
  Administered 2011-12-28: 0.5 mg via ORAL
  Administered 2011-12-29: 1 mg via ORAL
  Filled 2011-12-27 (×3): qty 1
  Filled 2011-12-27: qty 2

## 2011-12-27 MED ORDER — OXYCODONE HCL 5 MG PO TABS: 5.0000 mg | ORAL_TABLET | Freq: Once | ORAL | Status: DC | PRN

## 2011-12-27 MED ORDER — GLIMEPIRIDE 4 MG PO TABS
8.0000 mg | ORAL_TABLET | Freq: Every day | ORAL | Status: DC
  Administered 2011-12-28: 4 mg via ORAL
  Administered 2011-12-29: 8 mg via ORAL
  Filled 2011-12-27 (×3): qty 2

## 2011-12-27 MED ORDER — ONDANSETRON HCL 4 MG/2ML IJ SOLN
INTRAMUSCULAR | Status: AC
  Filled 2011-12-27: qty 2

## 2011-12-27 MED ORDER — MENTHOL 3 MG MT LOZG: 1.0000 | LOZENGE | OROMUCOSAL | Status: DC | PRN

## 2011-12-27 MED ORDER — LIDOCAINE-EPINEPHRINE 1 %-1:100000 IJ SOLN
INTRAMUSCULAR | Status: DC | PRN
  Administered 2011-12-27: 7.5 mL

## 2011-12-27 MED ORDER — ALBUMIN HUMAN 5 % IV SOLN
INTRAVENOUS | Status: DC | PRN
  Administered 2011-12-27 (×2): via INTRAVENOUS

## 2011-12-27 MED ORDER — DIPHENHYDRAMINE HCL 12.5 MG/5ML PO ELIX: 12.5000 mg | ORAL_SOLUTION | Freq: Four times a day (QID) | ORAL | Status: DC | PRN

## 2011-12-27 MED ORDER — HYDROMORPHONE HCL PF 1 MG/ML IJ SOLN
0.2500 mg | INTRAMUSCULAR | Status: DC | PRN
  Administered 2011-12-27 (×2): 0.5 mg via INTRAVENOUS
  Administered 2011-12-27: 0.25 mg via INTRAVENOUS
  Administered 2011-12-27: 0.5 mg via INTRAVENOUS
  Administered 2011-12-27: 0.25 mg via INTRAVENOUS

## 2011-12-27 MED ORDER — HYDROCODONE-ACETAMINOPHEN 10-325 MG PO TABS
1.0000 | ORAL_TABLET | Freq: Four times a day (QID) | ORAL | Status: DC | PRN
  Administered 2011-12-27: 1 via ORAL
  Filled 2011-12-27: qty 1

## 2011-12-27 MED ORDER — LACTATED RINGERS IV SOLN
INTRAVENOUS | Status: DC | PRN
  Administered 2011-12-27 (×4): via INTRAVENOUS

## 2011-12-27 MED ORDER — MORPHINE SULFATE (PF) 1 MG/ML IV SOLN
INTRAVENOUS | Status: DC
  Administered 2011-12-27: 16:00:00 via INTRAVENOUS
  Administered 2011-12-27: 9 mg via INTRAVENOUS
  Administered 2011-12-27: 10.5 mg via INTRAVENOUS
  Administered 2011-12-28: 10 mg via INTRAVENOUS
  Administered 2011-12-28: 3 mg via INTRAVENOUS
  Filled 2011-12-27: qty 25

## 2011-12-27 MED ORDER — GLYCOPYRROLATE 0.2 MG/ML IJ SOLN
INTRAMUSCULAR | Status: DC | PRN
  Administered 2011-12-27: 0.1 mg via INTRAVENOUS
  Administered 2011-12-27: .6 mg via INTRAVENOUS
  Administered 2011-12-27: 0.3 mg via INTRAVENOUS

## 2011-12-27 MED ORDER — PANTOPRAZOLE SODIUM 40 MG IV SOLR
40.0000 mg | Freq: Every day | INTRAVENOUS | Status: DC
  Administered 2011-12-27: 40 mg via INTRAVENOUS
  Filled 2011-12-27 (×2): qty 40

## 2011-12-27 MED ORDER — ONDANSETRON HCL 4 MG/2ML IJ SOLN
4.0000 mg | INTRAMUSCULAR | Status: DC | PRN
  Administered 2011-12-27: 4 mg via INTRAVENOUS
  Filled 2011-12-27: qty 2

## 2011-12-27 MED ORDER — SODIUM CHLORIDE 0.9 % IV SOLN
INTRAVENOUS | Status: AC
  Filled 2011-12-27: qty 500

## 2011-12-27 MED ORDER — OXYCODONE HCL 5 MG/5ML PO SOLN: 5.0000 mg | Freq: Once | ORAL | Status: DC | PRN

## 2011-12-27 MED ORDER — ONDANSETRON HCL 4 MG/2ML IJ SOLN
INTRAMUSCULAR | Status: DC | PRN
  Administered 2011-12-27: 4 mg via INTRAVENOUS

## 2011-12-27 MED ORDER — DOCUSATE SODIUM 100 MG PO CAPS
100.0000 mg | ORAL_CAPSULE | Freq: Two times a day (BID) | ORAL | Status: DC
  Administered 2011-12-27 – 2011-12-29 (×4): 100 mg via ORAL
  Filled 2011-12-27 (×3): qty 1

## 2011-12-27 MED ORDER — ONDANSETRON HCL 4 MG/2ML IJ SOLN
4.0000 mg | Freq: Four times a day (QID) | INTRAMUSCULAR | Status: DC | PRN
  Administered 2011-12-27: 4 mg via INTRAVENOUS

## 2011-12-27 MED ORDER — TRAZODONE HCL 50 MG PO TABS
50.0000 mg | ORAL_TABLET | Freq: Every day | ORAL | Status: DC
Start: 2011-12-27 — End: 2011-12-29
  Administered 2011-12-27 – 2011-12-28 (×2): 50 mg via ORAL
  Filled 2011-12-27 (×3): qty 1

## 2011-12-27 MED ORDER — ROCURONIUM BROMIDE 100 MG/10ML IV SOLN
INTRAVENOUS | Status: DC | PRN
  Administered 2011-12-27 (×2): 10 mg via INTRAVENOUS
  Administered 2011-12-27: 50 mg via INTRAVENOUS
  Administered 2011-12-27 (×5): 10 mg via INTRAVENOUS

## 2011-12-27 MED ORDER — ALUM & MAG HYDROXIDE-SIMETH 200-200-20 MG/5ML PO SUSP: 30.0000 mL | Freq: Four times a day (QID) | ORAL | Status: DC | PRN

## 2011-12-27 MED ORDER — BACITRACIN 50000 UNITS IM SOLR
INTRAMUSCULAR | Status: AC
  Filled 2011-12-27: qty 1

## 2011-12-27 MED ORDER — NYSTATIN 100000 UNIT/GM EX CREA
1.0000 "application " | TOPICAL_CREAM | Freq: Every day | CUTANEOUS | Status: DC
  Administered 2011-12-28: 1 via TOPICAL
  Filled 2011-12-27: qty 15

## 2011-12-27 MED ORDER — ZOLPIDEM TARTRATE 5 MG PO TABS
5.0000 mg | ORAL_TABLET | Freq: Every evening | ORAL | Status: DC | PRN
  Administered 2011-12-29: 5 mg via ORAL
  Filled 2011-12-27: qty 1

## 2011-12-27 MED ORDER — SODIUM CHLORIDE 0.9 % IR SOLN
Status: DC | PRN
  Administered 2011-12-27: 08:00:00

## 2011-12-27 MED ORDER — KCL IN DEXTROSE-NACL 20-5-0.45 MEQ/L-%-% IV SOLN
INTRAVENOUS | Status: DC
Start: 2011-12-27 — End: 2011-12-29
  Administered 2011-12-28: 02:00:00 via INTRAVENOUS
  Filled 2011-12-27 (×5): qty 1000

## 2011-12-27 MED ORDER — PHENYLEPHRINE HCL 10 MG/ML IJ SOLN
10.0000 mg | INTRAVENOUS | Status: DC | PRN
  Administered 2011-12-27: 10 ug/min via INTRAVENOUS

## 2011-12-27 MED ORDER — PROPOFOL 10 MG/ML IV BOLUS
INTRAVENOUS | Status: DC | PRN
  Administered 2011-12-27: 170 mg via INTRAVENOUS

## 2011-12-27 MED ORDER — SODIUM CHLORIDE 0.9 % IV SOLN: 250.0000 mL | INTRAVENOUS | Status: DC

## 2011-12-27 MED ORDER — EPHEDRINE SULFATE 50 MG/ML IJ SOLN
INTRAMUSCULAR | Status: DC | PRN
  Administered 2011-12-27 (×2): 5 mg via INTRAVENOUS

## 2011-12-27 MED ORDER — POLYETHYLENE GLYCOL 3350 17 G PO PACK
17.0000 g | PACK | Freq: Every day | ORAL | Status: DC | PRN
  Administered 2011-12-29: 17 g via ORAL
  Filled 2011-12-27: qty 1

## 2011-12-27 MED ORDER — BISACODYL 10 MG RE SUPP: 10.0000 mg | Freq: Every day | RECTAL | Status: DC | PRN

## 2011-12-27 MED ORDER — FLEET ENEMA 7-19 GM/118ML RE ENEM: 1.0000 | ENEMA | Freq: Once | RECTAL | Status: AC | PRN

## 2011-12-27 MED ORDER — METOPROLOL TARTRATE 100 MG PO TABS
100.0000 mg | ORAL_TABLET | Freq: Two times a day (BID) | ORAL | Status: DC
  Administered 2011-12-27 – 2011-12-29 (×3): 100 mg via ORAL
  Filled 2011-12-27 (×5): qty 1

## 2011-12-27 MED ORDER — NEOSTIGMINE METHYLSULFATE 1 MG/ML IJ SOLN
INTRAMUSCULAR | Status: DC | PRN
  Administered 2011-12-27: 4 mg via INTRAVENOUS

## 2011-12-27 MED ORDER — METHOCARBAMOL 500 MG PO TABS
500.0000 mg | ORAL_TABLET | Freq: Four times a day (QID) | ORAL | Status: DC | PRN
  Administered 2011-12-28 – 2011-12-29 (×5): 500 mg via ORAL
  Filled 2011-12-27 (×6): qty 1

## 2011-12-27 SURGICAL SUPPLY — 79 items
BAG DECANTER FOR FLEXI CONT (MISCELLANEOUS) ×2 IMPLANT
BENZOIN TINCTURE PRP APPL 2/3 (GAUZE/BANDAGES/DRESSINGS) ×4 IMPLANT
BLADE SURG ROTATE 9660 (MISCELLANEOUS) IMPLANT
BONE VOID FILLER STRIP 10CC (Bone Implant) ×2 IMPLANT
BUR MATCHSTICK NEURO 3.0 LAGG (BURR) ×2 IMPLANT
BUR PRECISION FLUTE 5.0 (BURR) ×2 IMPLANT
CAGE TLIF 8MM (Cage) ×4 IMPLANT
CANISTER SUCTION 2500CC (MISCELLANEOUS) ×2 IMPLANT
CLOTH BEACON ORANGE TIMEOUT ST (SAFETY) ×2 IMPLANT
CONT SPEC 4OZ CLIKSEAL STRL BL (MISCELLANEOUS) ×4 IMPLANT
COVER BACK TABLE 24X17X13 BIG (DRAPES) IMPLANT
COVER TABLE BACK 60X90 (DRAPES) ×2 IMPLANT
DERMABOND ADVANCED (GAUZE/BANDAGES/DRESSINGS)
DERMABOND ADVANCED .7 DNX12 (GAUZE/BANDAGES/DRESSINGS) IMPLANT
DRAPE C-ARM 42X72 X-RAY (DRAPES) ×4 IMPLANT
DRAPE C-ARMOR (DRAPES) ×2 IMPLANT
DRAPE LAPAROTOMY 100X72X124 (DRAPES) ×2 IMPLANT
DRAPE POUCH INSTRU U-SHP 10X18 (DRAPES) ×2 IMPLANT
DRAPE SURG 17X23 STRL (DRAPES) ×2 IMPLANT
DRESSING TELFA 8X3 (GAUZE/BANDAGES/DRESSINGS) ×2 IMPLANT
DURAPREP 26ML APPLICATOR (WOUND CARE) ×2 IMPLANT
ELECT REM PT RETURN 9FT ADLT (ELECTROSURGICAL) ×2
ELECTRODE REM PT RTRN 9FT ADLT (ELECTROSURGICAL) ×1 IMPLANT
GAUZE SPONGE 4X4 16PLY XRAY LF (GAUZE/BANDAGES/DRESSINGS) ×2 IMPLANT
GLOVE BIO SURGEON STRL SZ8 (GLOVE) ×4 IMPLANT
GLOVE BIOGEL PI IND STRL 7.0 (GLOVE) ×1 IMPLANT
GLOVE BIOGEL PI IND STRL 7.5 (GLOVE) ×2 IMPLANT
GLOVE BIOGEL PI IND STRL 8 (GLOVE) ×2 IMPLANT
GLOVE BIOGEL PI IND STRL 8.5 (GLOVE) ×2 IMPLANT
GLOVE BIOGEL PI INDICATOR 7.0 (GLOVE) ×1
GLOVE BIOGEL PI INDICATOR 7.5 (GLOVE) ×2
GLOVE BIOGEL PI INDICATOR 8 (GLOVE) ×2
GLOVE BIOGEL PI INDICATOR 8.5 (GLOVE) ×2
GLOVE ECLIPSE 7.5 STRL STRAW (GLOVE) ×4 IMPLANT
GLOVE ECLIPSE 8.0 STRL XLNG CF (GLOVE) ×4 IMPLANT
GLOVE EXAM NITRILE LRG STRL (GLOVE) IMPLANT
GLOVE EXAM NITRILE MD LF STRL (GLOVE) IMPLANT
GLOVE EXAM NITRILE XL STR (GLOVE) IMPLANT
GLOVE EXAM NITRILE XS STR PU (GLOVE) IMPLANT
GLOVE SURG SS PI 7.0 STRL IVOR (GLOVE) ×6 IMPLANT
GOWN BRE IMP SLV AUR LG STRL (GOWN DISPOSABLE) IMPLANT
GOWN BRE IMP SLV AUR XL STRL (GOWN DISPOSABLE) ×6 IMPLANT
GOWN STRL REIN 2XL LVL4 (GOWN DISPOSABLE) ×4 IMPLANT
KIT BASIN OR (CUSTOM PROCEDURE TRAY) ×2 IMPLANT
KIT INFUSE MEDIUM (Orthopedic Implant) ×2 IMPLANT
KIT POSITION SURG JACKSON T1 (MISCELLANEOUS) ×2 IMPLANT
KIT ROOM TURNOVER OR (KITS) ×2 IMPLANT
MILL MEDIUM DISP (BLADE) ×2 IMPLANT
NEEDLE HYPO 25X1 1.5 SAFETY (NEEDLE) ×2 IMPLANT
NEEDLE SPNL 18GX3.5 QUINCKE PK (NEEDLE) IMPLANT
NS IRRIG 1000ML POUR BTL (IV SOLUTION) ×2 IMPLANT
PACK LAMINECTOMY NEURO (CUSTOM PROCEDURE TRAY) ×2 IMPLANT
PAD ARMBOARD 7.5X6 YLW CONV (MISCELLANEOUS) ×6 IMPLANT
PATTIES SURGICAL .5 X.5 (GAUZE/BANDAGES/DRESSINGS) IMPLANT
PATTIES SURGICAL .5 X1 (DISPOSABLE) IMPLANT
PATTIES SURGICAL 1X1 (DISPOSABLE) IMPLANT
ROD CP TI 5.5X11CM (Rod) ×4 IMPLANT
SCREW 40MM (Screw) ×12 IMPLANT
SCREW POLYAX 6.5X45MM (Screw) ×8 IMPLANT
SCREW SET SPINAL STD HEXALOBE (Screw) ×20 IMPLANT
SPONGE GAUZE 4X4 12PLY (GAUZE/BANDAGES/DRESSINGS) ×2 IMPLANT
SPONGE LAP 4X18 X RAY DECT (DISPOSABLE) ×4 IMPLANT
SPONGE SURGIFOAM ABS GEL 100 (HEMOSTASIS) ×2 IMPLANT
STAPLER SKIN PROX WIDE 3.9 (STAPLE) IMPLANT
STRIP CLOSURE SKIN 1/2X4 (GAUZE/BANDAGES/DRESSINGS) ×4 IMPLANT
SUT VIC AB 1 CT1 18XBRD ANBCTR (SUTURE) ×2 IMPLANT
SUT VIC AB 1 CT1 8-18 (SUTURE) ×2
SUT VIC AB 2-0 CT1 18 (SUTURE) ×4 IMPLANT
SUT VIC AB 3-0 SH 8-18 (SUTURE) ×4 IMPLANT
SYR 20CC LL (SYRINGE) ×2 IMPLANT
SYR 3ML LL SCALE MARK (SYRINGE) ×8 IMPLANT
SYR 5ML LL (SYRINGE) IMPLANT
TAPE CLOTH SURG 4X10 WHT LF (GAUZE/BANDAGES/DRESSINGS) ×2 IMPLANT
TLIF 9MM (Spine Construct) ×2 IMPLANT
TOWEL OR 17X24 6PK STRL BLUE (TOWEL DISPOSABLE) ×2 IMPLANT
TOWEL OR 17X26 10 PK STRL BLUE (TOWEL DISPOSABLE) ×2 IMPLANT
TRAP SPECIMEN MUCOUS 40CC (MISCELLANEOUS) ×2 IMPLANT
TRAY FOLEY CATH 14FRSI W/METER (CATHETERS) ×2 IMPLANT
WATER STERILE IRR 1000ML POUR (IV SOLUTION) ×2 IMPLANT

## 2011-12-27 NOTE — Anesthesia Postprocedure Evaluation (Signed)
  Anesthesia Post-op Note  Patient: Michelle Crane  Procedure(s) Performed: Procedure(s) (LRB) with comments: POSTERIOR LUMBAR FUSION 4 LEVEL (Bilateral) - Lumbar two-three, three-four, four-five, lumbar five sacral one Redo decompression/Fusion  Patient Location: PACU  Anesthesia Type:General  Level of Consciousness: awake  Airway and Oxygen Therapy: Patient Spontanous Breathing  Post-op Pain: moderate  Post-op Assessment: Post-op Vital signs reviewed  Post-op Vital Signs: stable  Complications: No apparent anesthesia complications

## 2011-12-27 NOTE — Progress Notes (Signed)
Received report from V. Dareen Piano, RN.

## 2011-12-27 NOTE — Interval H&P Note (Signed)
History and Physical Interval Note:  12/27/2011 7:29 AM  Michelle Crane  has presented today for surgery, with the diagnosis of Spondylolisthesis, Lumbar stenosis, Lumbar hnp without myelopathy, Lumbar radiculopathy  The various methods of treatment have been discussed with the patient and family. After consideration of risks, benefits and other options for treatment, the patient has consented to  Procedure(s) (LRB) with comments: POSTERIOR LUMBAR FUSION 4 LEVEL (N/A) - L2 to S1 Redo decompression/Fusion as a surgical intervention .  The patient's history has been reviewed, patient examined, no change in status, stable for surgery.  I have reviewed the patient's chart and labs.  Questions were answered to the patient's satisfaction.     [name]  Date of Initial H&P: 12/26/2011  History reviewed, patient examined, no change in status, stable for surgery.

## 2011-12-27 NOTE — Brief Op Note (Signed)
12/27/2011  2:09 PM  PATIENT:  Michelle Crane  56 y.o. female  PRE-OPERATIVE DIAGNOSIS:  Lumbar two through sacral one Spondylolisthesis, scoliosis, recurrent stenosis, Herniated nucleus pulposus  without myelopathy, radiculopathy  POST-OPERATIVE DIAGNOSIS:  Lumbar two through sacral one Spondylolisthesis, scoliosis, recurrent stenosis, Herniated nucleus pulposus  without myelopathy, radiculopathy  PROCEDURE:  Procedure(s) (LRB) with comments: POSTERIOR LUMBAR FUSION 4 LEVEL (Bilateral) - Lumbar two-three, three-four, four-five, lumbar five sacral one Redo decompression/Fusion with pedicle screw fixation and posterolateral arthrodesis  SURGEON:  Surgeon(s) and Role:    * Ethel Veronica, MD - Primary    * Randy O Kritzer, MD - Assisting  PHYSICIAN ASSISTANT:   ASSISTANTS: Poteat, RN   ANESTHESIA:   general  EBL:  Total I/O In: 3700 [I.V.:3000; Blood:200; IV Piggyback:500] Out: 1350 [Urine:350; Blood:1000]  BLOOD ADMINISTERED:200 CC PRBC  DRAINS: (Medium) Hemovact drain(s) in the epidural space with  Suction Open   LOCAL MEDICATIONS USED:  LIDOCAINE   SPECIMEN:  No Specimen  DISPOSITION OF SPECIMEN:  N/A  COUNTS:  YES  TOURNIQUET:  * No tourniquets in log *  DICTATION: Patient is 56-year-old woman with lumbar scoliosis and a disc herniations L 2/3, 3/4, L 4/5 spondylolisthesis, L5/S1 with severe recurrent spinal stenosis and a long history of severe back and bilateral leg pain, . She has a severe radiculopathy. It was elected to take her to surgery for redo decompression and fusion from L2/3 through L5/S1 levels.   Procedure: Patient was placed in a prone position on the Jackson table after smooth and uncomplicated induction of general endotracheal anesthesia. Her low back was prepped and draped in usual sterile fashion with Betadine scrub followed by DuraPrep. Area of incision was infiltrated with local lidocaine. Incision was made to the lumbodorsal fascia was incised and  exposure was performed of the L2-S1 spinous processes laminae facet joint and transverse processes. Intraoperative x-ray was obtained which confirmed correct orientation with marker probes at L2 through sacral levels. A total laminectomy of L2 through L5 levels was performed with disarticulation of the facet joints and thorough decompression was performed of both L2, L3, L4, L5, S1   S1 nerve roots along with the common dural tube.  This was a redo decompression at L4/5 and L5/S1 levels.  On the L5/S1 level, there appeared to be a conjoined nerve root, so it was not possible to do a discectomy at this level.  Facetectomy was performed on the right as well to decompress all neural elements.  It was elected not to perform interbody grafting at this level. Decompression was greater than would be typical for PLIF. A thorough discectomy with removal of disc at the L2/3,  L3/4 and L4/5 levels. A thorough discectomy was then performed on the left with preparation of the endplates for grafting a trial spacer was at each level.  After trial sizing and utilization of sequential shavers, interspaces were packed with BMP, autograft and PEEK cages with NexOss Bone graft extender.  Bone autograft was packed within the interspace bilaterally along with medium BMP kit and NexOss bone graft extender.  9 mm peek TLIF cages were packed with BMP and extender and was inserted the interspace and countersunk appropriately at the L 4/5 level, 8 mm TLIF cage at L 3/4, and 8 mm cages at L 2/3 . There was good correction of scoliotic curvature. The posterolateral region was extensively decorticated and pedicle probes were placed at L2 through S1 bilaterally. Intraoperative fluoroscopy confirmed correct orientationin the AP and lateral plane.   40 x 6.5 mm pedicle screws were placed at S1 bilaterally and 40 x 6.5 mm screws placed at L 4, L 5  bilaterally and 6.5 x 45mm screws were placed at L 2  And L3 bilaterally. Final x-rays demonstrated  well-positioned interbody grafts and pedicle screw fixation. A 110 mm lordotic rod was placed on the right and a 110 mm rod was placed on the left locked down in situ and the posterolateral region was packed with the remaining NexOss and bone graft bilaterally. The wound was irrigated. A medium Hemovac drain was inserted through a separate stab incision.Fascia was closed with 1 Vicryl sutures skin edges were reapproximated 2 and 3-0 Vicryl sutures. The wound was dressed with benzoin Steri-Strips Telfa gauze and tape the patient was extubated in the operating room and taken to recovery in stable satisfactory condition. she tolerated the operation well. Counts were correct at the end of the case.   PLAN OF CARE: Admit to inpatient   PATIENT DISPOSITION:  PACU - hemodynamically stable.   Delay start of Pharmacological VTE agent (>24hrs) due to surgical blood loss or risk of bleeding: yes  

## 2011-12-27 NOTE — Transfer of Care (Signed)
Immediate Anesthesia Transfer of Care Note  Patient: Michelle Crane  Procedure(s) Performed: Procedure(s) (LRB) with comments: POSTERIOR LUMBAR FUSION 4 LEVEL (Bilateral) - Lumbar two-three, three-four, four-five, lumbar five sacral one Redo decompression/Fusion  Patient Location: PACU  Anesthesia Type:General  Level of Consciousness: awake, alert  and oriented  Airway & Oxygen Therapy: Patient Spontanous Breathing and Patient connected to nasal cannula oxygen  Post-op Assessment: Report given to PACU RN, Post -op Vital signs reviewed and stable and Patient moving all extremities  Post vital signs: Reviewed and stable  Complications: No apparent anesthesia complications

## 2011-12-27 NOTE — Op Note (Signed)
12/27/2011  2:09 PM  PATIENT:  Michelle Crane  56 y.o. female  PRE-OPERATIVE DIAGNOSIS:  Lumbar two through sacral one Spondylolisthesis, scoliosis, recurrent stenosis, Herniated nucleus pulposus  without myelopathy, radiculopathy  POST-OPERATIVE DIAGNOSIS:  Lumbar two through sacral one Spondylolisthesis, scoliosis, recurrent stenosis, Herniated nucleus pulposus  without myelopathy, radiculopathy  PROCEDURE:  Procedure(s) (LRB) with comments: POSTERIOR LUMBAR FUSION 4 LEVEL (Bilateral) - Lumbar two-three, three-four, four-five, lumbar five sacral one Redo decompression/Fusion with pedicle screw fixation and posterolateral arthrodesis  SURGEON:  Surgeon(s) and Role:    * Maeola Harman, MD - Primary    * Reinaldo Meeker, MD - Assisting  PHYSICIAN ASSISTANT:   ASSISTANTS: Poteat, RN   ANESTHESIA:   general  EBL:  Total I/O In: 3700 [I.V.:3000; Blood:200; IV Piggyback:500] Out: 1350 [Urine:350; Blood:1000]  BLOOD ADMINISTERED:200 CC PRBC  DRAINS: (Medium) Hemovact drain(s) in the epidural space with  Suction Open   LOCAL MEDICATIONS USED:  LIDOCAINE   SPECIMEN:  No Specimen  DISPOSITION OF SPECIMEN:  N/A  COUNTS:  YES  TOURNIQUET:  * No tourniquets in log *  DICTATION: Patient is 57 year old woman with lumbar scoliosis and a disc herniations L 2/3, 3/4, L 4/5 spondylolisthesis, L5/S1 with severe recurrent spinal stenosis and a long history of severe back and bilateral leg pain, . She has a severe radiculopathy. It was elected to take her to surgery for redo decompression and fusion from L2/3 through L5/S1 levels.   Procedure: Patient was placed in a prone position on the [place] table after smooth and uncomplicated induction of general endotracheal anesthesia. Her low back was prepped and draped in usual sterile fashion with Betadine scrub followed by DuraPrep. Area of incision was infiltrated with local lidocaine. Incision was made to the lumbodorsal fascia was incised and  exposure was performed of the L2-S1 spinous processes laminae facet joint and transverse processes. Intraoperative x-ray was obtained which confirmed correct orientation with marker probes at L2 through sacral levels. A total laminectomy of L2 through L5 levels was performed with disarticulation of the facet joints and thorough decompression was performed of both L2, L3, L4, L5, S1   S1 nerve roots along with the common dural tube.  This was a redo decompression at L4/5 and L5/S1 levels.  On the L5/S1 level, there appeared to be a conjoined nerve root, so it was not possible to do a discectomy at this level.  Facetectomy was performed on the right as well to decompress all neural elements.  It was elected not to perform interbody grafting at this level. Decompression was greater than would be typical for PLIF. A thorough discectomy with removal of disc at the L2/3,  L3/4 and L4/5 levels. A thorough discectomy was then performed on the left with preparation of the endplates for grafting a trial spacer was at each level.  After trial sizing and utilization of sequential shavers, interspaces were packed with BMP, autograft and PEEK cages with NexOss Bone graft extender.  Bone autograft was packed within the interspace bilaterally along with medium BMP kit and NexOss bone graft extender.  9 mm peek TLIF cages were packed with BMP and extender and was inserted the interspace and countersunk appropriately at the L 4/5 level, 8 mm TLIF cage at L 3/4, and 8 mm cages at L 2/3 . There was good correction of scoliotic curvature. The posterolateral region was extensively decorticated and pedicle probes were placed at L2 through S1 bilaterally. Intraoperative fluoroscopy confirmed correct orientationin the AP and lateral plane.  40 x 6.5 mm pedicle screws were placed at S1 bilaterally and 40 x 6.5 mm screws placed at L 4, L 5  bilaterally and 6.5 x 45mm screws were placed at L 2  And L3 bilaterally. Final x-rays demonstrated  well-positioned interbody grafts and pedicle screw fixation. A 110 mm lordotic rod was placed on the right and a 110 mm rod was placed on the left locked down in situ and the posterolateral region was packed with the remaining NexOss and bone graft bilaterally. The wound was irrigated. A medium Hemovac drain was inserted through a separate stab incision.Fascia was closed with 1 Vicryl sutures skin edges were reapproximated 2 and 3-0 Vicryl sutures. The wound was dressed with benzoin Steri-Strips Telfa gauze and tape the patient was extubated in the operating room and taken to recovery in stable satisfactory condition. she tolerated the operation well. Counts were correct at the end of the case.   PLAN OF CARE: Admit to inpatient   PATIENT DISPOSITION:  PACU - hemodynamically stable.   Delay start of Pharmacological VTE agent (>24hrs) due to surgical blood loss or risk of bleeding: yes

## 2011-12-27 NOTE — Anesthesia Procedure Notes (Addendum)
Procedure Name: Intubation Date/Time: 12/27/2011 7:36 AM Performed by: Rossie Muskrat L Pre-anesthesia Checklist: Patient identified, Timeout performed, Emergency Drugs available, Suction available and Patient being monitored Patient Re-evaluated:Patient Re-evaluated prior to inductionOxygen Delivery Method: Circle system utilized Preoxygenation: Pre-oxygenation with 100% oxygen Intubation Type: IV induction Ventilation: Mask ventilation without difficulty Laryngoscope Size: Miller and 2 Grade View: Grade II Tube type: Oral Tube size: 7.0 mm Number of attempts: 1 Airway Equipment and Method: Stylet Placement Confirmation: ETT inserted through vocal cords under direct vision,  breath sounds checked- equal and bilateral and positive ETCO2 Secured at: 22 cm Tube secured with: Tape Dental Injury: Teeth and Oropharynx as per pre-operative assessment

## 2011-12-27 NOTE — Preoperative (Signed)
Beta Blockers   Reason not to administer Beta Blockers:B blocker taken 12/27/11 in am per pt

## 2011-12-27 NOTE — Anesthesia Preprocedure Evaluation (Signed)
Anesthesia Evaluation  Patient identified by MRN, date of birth, ID band Patient awake    Reviewed: Allergy & Precautions, NPO status , Patient's Chart, lab work & pertinent test results  History of Anesthesia Complications (+) PONVNegative for: history of anesthetic complications  Airway Mallampati: II  Neck ROM: Full    Dental  (+) Teeth Intact   Pulmonary shortness of breath,          Cardiovascular hypertension, Rhythm:Regular Rate:Normal     Neuro/Psych  Headaches,  Neuromuscular disease    GI/Hepatic Neg liver ROS, GERD-  ,  Endo/Other  diabetes  Renal/GU negative Renal ROS     Musculoskeletal   Abdominal (+) + obese,   Peds  Hematology   Anesthesia Other Findings   Reproductive/Obstetrics                           Anesthesia Physical Anesthesia Plan  ASA: III  Anesthesia Plan: General   Post-op Pain Management:    Induction: Intravenous  Airway Management Planned: Oral ETT  Additional Equipment:   Intra-op Plan:   Post-operative Plan: Extubation in OR  Informed Consent: I have reviewed the patients History and Physical, chart, labs and discussed the procedure including the risks, benefits and alternatives for the proposed anesthesia with the patient or authorized representative who has indicated his/her understanding and acceptance.   Dental advisory given  Plan Discussed with: CRNA and Surgeon  Anesthesia Plan Comments:         Anesthesia Quick Evaluation

## 2011-12-27 NOTE — Progress Notes (Signed)
Awake, alert, moving all extremities well with good PF/DF.  Doing well.

## 2011-12-28 ENCOUNTER — Encounter (HOSPITAL_COMMUNITY): Payer: Self-pay | Admitting: Neurosurgery

## 2011-12-28 LAB — HEMOGLOBIN A1C
Hgb A1c MFr Bld: 6.7 % — ABNORMAL HIGH (ref <5.7)
Mean Plasma Glucose: 146 mg/dL — ABNORMAL HIGH (ref <117)

## 2011-12-28 LAB — GLUCOSE, CAPILLARY
Glucose-Capillary: 146 mg/dL — ABNORMAL HIGH (ref 70–99)
Glucose-Capillary: 193 mg/dL — ABNORMAL HIGH (ref 70–99)

## 2011-12-28 MED ORDER — PANTOPRAZOLE SODIUM 40 MG PO TBEC
40.0000 mg | DELAYED_RELEASE_TABLET | Freq: Every day | ORAL | Status: DC
  Administered 2011-12-28: 40 mg via ORAL
  Filled 2011-12-28: qty 1

## 2011-12-28 NOTE — Progress Notes (Signed)
UR COMPLETED  

## 2011-12-28 NOTE — Progress Notes (Signed)
Pt up walking with walker , up in chair x3 ,volding without any problems pain being fairly controlled with meds says she stays in pain because of her artiritis

## 2011-12-28 NOTE — Progress Notes (Signed)
Subjective: Patient reports sore in back.  Objective: Vital signs in last 24 hours: Temp:  [97.1 F (36.2 C)-99.3 F (37.4 C)] 98.2 F (36.8 C) (12/04 0622) Pulse Rate:  [76-88] 83  (12/04 0622) Resp:  [13-29] 17  (12/04 0622) BP: (97-178)/(39-94) 109/52 mmHg (12/04 0622) SpO2:  [97 %-100 %] 98 % (12/04 0622) FiO2 (%):  [97 %] 97 % (12/03 2233)  Intake/Output from previous day: 12/03 0701 - 12/04 0700 In: 4600 [I.V.:3900; Blood:200; IV Piggyback:500] Out: 2050 [Urine:450; Drains:600; Blood:1000] Intake/Output this shift:    Physical Exam: Dressing CDI.  Full strength bilateral lower extremities without complaint of numbness.  Lab Results: No results found for this basename: WBC:2,HGB:2,HCT:2,PLT:2 in the last 72 hours BMET No results found for this basename: NA:2,K:2,CL:2,CO2:2,GLUCOSE:2,BUN:2,CREATININE:2,CALCIUM:2 in the last 72 hours  Studies/Results: Dg Lumbar Spine 2-3 Views  12/27/2011  *RADIOLOGY REPORT*  Clinical Data: L2 - S1 fusion  DG C-ARM 61-120 MIN,LUMBAR SPINE - 2-3 VIEW  Comparison: Lumbar spine radiographs - 11/07/2011  Findings:  A total of six spot intraoperative fluoroscopic images of the lower lumbar spine are submitted for review.  Lumbar spine is in keeping with preprocedural lumbar spine imaging.  Provided images demonstrate bilateral pedicle screws extending from L2-S1.  Intervertebral disc space replacements are seen at the intervening disc space levels.  There is marked improved alignment of the previously noted anterolisthesis of L4 upon L5.  No definite radiopaque foreign body.  IMPRESSION: Intraoperative spinal localization as above.   Original Report Authenticated By: Tacey Ruiz, MD    Dg Lumbar Spine 1 View  12/27/2011  *RADIOLOGY REPORT*  Clinical Data: L2-S1 fusion.  LUMBAR SPINE - 1 VIEW  Comparison: Lumbar MRI 11/28/2011.  Radiographs 11/07/2011 from [facility].  Findings: Cross-table lateral view labeled #1 at 0830 hours.  There is a  degenerative anterolisthesis at L4-L5.  There are skin spreaders extending from L2-L5.  Multiple surgical instruments overlie the posterior elements extending from the L2 through the S1 levels.  IMPRESSION: Intraoperative localization views as described.   Original Report Authenticated By: Carey Bullocks, M.D.    Dg C-arm 61-120 Min  12/27/2011  *RADIOLOGY REPORT*  Clinical Data: L2 - S1 fusion  DG C-ARM 61-120 MIN,LUMBAR SPINE - 2-3 VIEW  Comparison: Lumbar spine radiographs - 11/07/2011  Findings:  A total of six spot intraoperative fluoroscopic images of the lower lumbar spine are submitted for review.  Lumbar spine is in keeping with preprocedural lumbar spine imaging.  Provided images demonstrate bilateral pedicle screws extending from L2-S1.  Intervertebral disc space replacements are seen at the intervening disc space levels.  There is marked improved alignment of the previously noted anterolisthesis of L4 upon L5.  No definite radiopaque foreign body.  IMPRESSION: Intraoperative spinal localization as above.   Original Report Authenticated By: Tacey Ruiz, MD     Assessment/Plan: Doing well following decompression and fusion L2-S1.  Drain 100cc last shift.  Continue PCA today.  Mobilize in brace with PT.    LOS: 1 day    Dorian Heckle, MD 12/28/2011, 8:05 AM

## 2011-12-28 NOTE — Clinical Social Work Note (Signed)
Clinical Social Work   CSW reviewed chart. PT is not recommending any PT follow up. RNCM is aware and following. CSW is signing off as no further needs identified. Please reconsult if a needed arises prior to discharge.   Dede Query, MSW, Theresia Majors [phone]

## 2011-12-28 NOTE — Clinical Social Work Note (Signed)
Clinical Social Work   CSW received consult for SNF. CSW reviewed chart and discussed pt with RN during progression. Awaiting PT evals for discharge recommendations. CSW will assess for SNF, if appropriate. CSW will continue to follow.   Octavious Zidek, MSW, LCSWA #[phone] 

## 2011-12-28 NOTE — Progress Notes (Signed)
Wasted 25mg  of Morphine in sink witnessed by Christena Deem, RN

## 2011-12-28 NOTE — Evaluation (Signed)
Occupational Therapy Evaluation Patient Details Name: Michelle Crane MRN: [id number] DOB: [birth year] Today's Date: 12/28/2011 Time: 4098-1191 OT Time Calculation (min): 24 min  OT Assessment / Plan / Recommendation Clinical Impression  This 56 yo female s/p back fusion presents to acute OT with problems below. Will benefit from acute OT without need for follow up.    OT Assessment  Patient needs continued OT Services    Follow Up Recommendations  No OT follow up    Barriers to Discharge None    Equipment Recommendations  None recommended by OT       Frequency  Min 2X/week    Precautions / Restrictions Precautions Precautions: Back Precaution Booklet Issued: Yes (comment) Required Braces or Orthoses: Spinal Brace Spinal Brace: Applied in sitting position Restrictions Weight Bearing Restrictions: No   Pertinent Vitals/Pain 10/10 back (did not want to use PCA, wanted pain meds PO--RN brought them in to her. Said she still wanted to get up even with the pain this high)    ADL  Eating/Feeding: Simulated;Independent Where Assessed - Eating/Feeding: Chair Grooming: Simulated;Set up Where Assessed - Grooming: Unsupported sitting Upper Body Bathing: Simulated;Set up Where Assessed - Upper Body Bathing: Unsupported sitting Lower Body Bathing: Simulated;Moderate assistance Where Assessed - Lower Body Bathing: Unsupported sit to stand Upper Body Dressing: Simulated;Set up Where Assessed - Upper Body Dressing: Unsupported sitting Lower Body Dressing: Simulated;Maximal assistance Where Assessed - Lower Body Dressing: Unsupported sit to stand Toilet Transfer: Simulated;Min guard Toilet Transfer Method: Sit to Barista:  (Bed around and out door back in room to recliner) Toileting - Architect and Hygiene: Simulated;Min guard Where Assessed - Toileting Clothing Manipulation and Hygiene: Standing Transfers/Ambulation Related to ADLs: Min guard A sit  to stand, S for ambulation    OT Diagnosis: Generalized weakness;Acute pain  OT Problem List: Impaired balance (sitting and/or standing);Decreased knowledge of use of DME or AE;Decreased knowledge of precautions;Pain OT Treatment Interventions: Self-care/ADL training;DME and/or AE instruction;Patient/family education;Balance training   OT Goals Acute Rehab OT Goals OT Goal Formulation: With patient Time For Goal Achievement: 01/04/12 Potential to Achieve Goals: Good ADL Goals Pt Will Perform Grooming: with supervision;Unsupported;Standing at sink ADL Goal: Grooming - Progress: Goal set today Pt Will Perform Lower Body Bathing: with supervision;Unsupported;Sit to stand from bed;Sit to stand from chair;with adaptive equipment ADL Goal: Lower Body Bathing - Progress: Goal set today Pt Will Perform Lower Body Dressing: with supervision;Unsupported;Sit to stand from chair;Sit to stand from bed;with adaptive equipment ADL Goal: Lower Body Dressing - Progress: Goal set today Pt Will Transfer to Toilet: with modified independence;Ambulation;with DME;Comfort height toilet;Grab bars ADL Goal: Toilet Transfer - Progress: Goal set today Pt Will Perform Toileting - Clothing Manipulation: with modified independence;Standing ADL Goal: Toileting - Clothing Manipulation - Progress: Goal set today Pt Will Perform Toileting - Hygiene: with modified independence;Sit to stand from 3-in-1/toilet ADL Goal: Toileting - Hygiene - Progress: Goal set today Pt Will Perform Tub/Shower Transfer: Shower transfer;Ambulation;with DME;Shower seat without back;Maintaining back safety precautions ADL Goal: Web designer - Progress: Goal set today Miscellaneous OT Goals Miscellaneous OT Goal #1: Pt will be able to get in and OOB with Supervision for BADLs OT Goal: Miscellaneous Goal #1 - Progress: Goal set today Miscellaneous OT Goal #2: Pt will be able to don/doff brace independently OT Goal: Miscellaneous Goal #2 -  Progress: Goal set today Miscellaneous OT Goal #3: Pt will be able to state and follow back precautions witihout A or VCs. OT Goal: Miscellaneous Goal #  3 - Progress: Goal set today  Visit Information  Last OT Received On: 12/28/11 Assistance Needed: +1 PT/OT Co-Evaluation/Treatment: Yes    Subjective Data  Subjective: This is my 3rd back surgery Patient Stated Goal: I am ready to get out of this bed   Prior Functioning     Home Living Lives With: Spouse Available Help at Discharge: Family;Available 24 hours/day Type of Home: House Home Access: Stairs to enter [employer] of Steps: 1 Entrance Stairs-Rails: None Home Layout: One level Bathroom Shower/Tub: Walk-in shower;Door [employer] Toilet: Standard Bathroom Accessibility: Yes How Accessible: Accessible via walker Home Adaptive Equipment: Bedside commode/3-in-1;Walker - four wheeled;Shower chair with back;Straight cane Prior Function Level of Independence: Independent Able to Take Stairs?: Yes Driving: Yes Vocation: On disability Communication Communication: No difficulties Dominant Hand: Right            Cognition  Overall Cognitive Status: Appears within functional limits for tasks assessed/performed Arousal/Alertness: Awake/alert Orientation Level: Appears intact for tasks assessed Behavior During Session: [facility] for tasks performed    Extremity/Trunk Assessment Right Upper Extremity Assessment RUE ROM/Strength/Tone: Within functional levels Left Upper Extremity Assessment LUE ROM/Strength/Tone: Within functional levels     Mobility Bed Mobility Bed Mobility: Rolling Left;Left Sidelying to Sit;Sitting - Scoot to Edge of Bed Rolling Left: 5: Supervision;With rail Left Sidelying to Sit: 4: Min assist;With rails;HOB flat Sitting - Scoot to Edge of Bed: 5: Supervision Transfers Transfers: Sit to Stand;Stand to Sit Sit to Stand: 4: Min guard;With upper extremity assist;From bed Stand to Sit: 4:  Min guard;With upper extremity assist;With armrests;To chair/3-in-1 Details for Transfer Assistance: VCs for hand placement              End of Session OT - End of Session Equipment Utilized During Treatment: Back brace (RW) Activity Tolerance: Patient tolerated treatment well Patient left: in chair;with call bell/phone within reach    Evette Georges [phone] 12/28/2011, 10:59 AM

## 2011-12-28 NOTE — Evaluation (Signed)
Physical Therapy Evaluation Patient Details Name: Michelle Crane MRN: [id number] DOB: [birth year] Today's Date: 12/28/2011 Time: 7829-5621 PT Time Calculation (min): 24 min  PT Assessment / Plan / Recommendation Clinical Impression  Pt is a 56 y.o. woman s/p lumbar surgery. Patient demonstrates deficits in functional mobility secondary to pain, weakness, decreased activity tolerance and restrictions. Pt will benefit from continued actue PT to address deficits and maximize independence for discharge home.    PT Assessment  Patient needs continued PT services    Follow Up Recommendations  No PT follow up          Equipment Recommendations  None recommended by PT       Frequency Min 4X/week    Precautions / Restrictions Precautions Precautions: Back Precaution Booklet Issued: Yes (comment) Required Braces or Orthoses: Spinal Brace Spinal Brace: Applied in sitting position Restrictions Weight Bearing Restrictions: No   Pertinent Vitals/Pain 10/10 pain but does not appear to be in distress      Mobility  Bed Mobility Bed Mobility: Rolling Left;Left Sidelying to Sit;Sitting - Scoot to Edge of Bed Rolling Left: 5: Supervision;With rail Left Sidelying to Sit: 4: Min assist;With rails;HOB flat Sitting - Scoot to Edge of Bed: 5: Supervision Transfers Transfers: Sit to Stand;Stand to Sit Sit to Stand: 4: Min guard;With upper extremity assist;From bed Stand to Sit: 4: Min guard;With upper extremity assist;With armrests;To chair/3-in-1 Details for Transfer Assistance: VCs for hand placement Ambulation/Gait Ambulation/Gait Assistance: 5: Supervision Ambulation Distance (Feet): 30 Feet Assistive device: Rolling walker Ambulation/Gait Assistance Details: pt steady despite pain Gait Pattern: Step-through pattern;Decreased stride length;Trunk flexed;Narrow base of support Gait velocity: decreased General Gait Details: Pt steady but guarded with ambulation Stairs: No            PT Diagnosis: Difficulty walking;Abnormality of gait;Generalized weakness;Acute pain  PT Problem List: Decreased strength;Decreased range of motion;Decreased activity tolerance;Decreased balance;Decreased mobility;Pain PT Treatment Interventions: DME instruction;Gait training;Therapeutic exercise;Therapeutic activities;Functional mobility training;Stair training;Patient/family education   PT Goals Acute Rehab PT Goals PT Goal Formulation: With patient Time For Goal Achievement: 01/02/12 Potential to Achieve Goals: Good Pt will Stand: with modified independence PT Goal: Stand - Progress: Goal set today Pt will Ambulate: >150 feet;with modified independence PT Goal: Ambulate - Progress: Goal set today Pt will Go Up / Down Stairs: 1-2 stairs PT Goal: Up/Down Stairs - Progress: Goal set today  Visit Information  Last PT Received On: 12/28/11 Assistance Needed: +1    Subjective Data  Subjective: I want to get out of bed Patient Stated Goal: to go home   Prior Functioning  Home Living Lives With: Spouse Available Help at Discharge: Family;Available 24 hours/day Type of Home: House Home Access: Stairs to enter [employer] of Steps: 1 Entrance Stairs-Rails: None Home Layout: One level Bathroom Shower/Tub: Walk-in shower;Door [employer] Toilet: Standard Bathroom Accessibility: Yes How Accessible: Accessible via walker Home Adaptive Equipment: Bedside commode/3-in-1;Walker - four wheeled;Shower chair with back;Straight cane Prior Function Level of Independence: Independent Able to Take Stairs?: Yes Driving: Yes Vocation: On disability Communication Communication: No difficulties Dominant Hand: Right    Cognition  Overall Cognitive Status: Appears within functional limits for tasks assessed/performed Arousal/Alertness: Awake/alert Orientation Level: Appears intact for tasks assessed Behavior During Session: [facility] for tasks performed    Extremity/Trunk Assessment  Right Upper Extremity Assessment RUE ROM/Strength/Tone: Within functional levels Left Upper Extremity Assessment LUE ROM/Strength/Tone: Within functional levels Right Lower Extremity Assessment RLE ROM/Strength/Tone: Within functional levels Left Lower Extremity Assessment LLE ROM/Strength/Tone: Within functional levels  Balance    End of Session PT - End of Session Equipment Utilized During Treatment: Gait belt;Back brace Activity Tolerance: Patient tolerated treatment well Patient left: in chair;with call bell/phone within reach Nurse Communication: Mobility status  GP     Fabio Asa 12/28/2011, 11:03 AM Charlotte Crumb, PT DPT  [phone]

## 2011-12-29 LAB — GLUCOSE, CAPILLARY

## 2011-12-29 MED ORDER — HYDROCODONE-ACETAMINOPHEN 10-325 MG PO TABS: 1.0000 | ORAL_TABLET | Freq: Four times a day (QID) | ORAL | Status: DC | PRN

## 2011-12-29 MED ORDER — METHOCARBAMOL 500 MG PO TABS: 500.0000 mg | ORAL_TABLET | Freq: Four times a day (QID) | ORAL | Status: DC | PRN

## 2011-12-29 NOTE — Progress Notes (Signed)
Pt discharged home condition stable. pt stated did not need any assistive  Equipment to take home . Already has rolling walker. 3-in 1 tub bench at home

## 2011-12-29 NOTE — Discharge Summary (Signed)
Physician Discharge Summary  Patient ID: Michelle Crane MRN: [id number] DOB/AGE: 09/21/1955 56 y.o.  Admit date: 12/27/2011 Discharge date: 12/29/2011  Admission Diagnoses: Lumbar two through sacral one Spondylolisthesis, scoliosis, recurrent stenosis, Herniated nucleus pulposus  without myelopathy, radiculopathy    Discharge Diagnoses: Lumbar two through sacral one Spondylolisthesis, scoliosis, recurrent stenosis, Herniated nucleus pulposus  without myelopathy, radiculopathy s/p POSTERIOR LUMBAR FUSION 4 LEVEL (Bilateral) - Lumbar two-three, three-four, four-five, lumbar five sacral one Redo decompression/Fusion with pedicle screw fixation and posterolateral arthrodesis   Active Problems:  * No active hospital problems. *    Discharged Condition: good  Hospital Course: Michelle Crane was admitted on 12-27-11 for surgery with dx spondylolisthesis, scoliosis, recurrent stenosis, herniated nucleus pulposus  without myelopathy, & radiculopathy. Following uncomplicated decompression and fusion L2-S1, she recovered well in [facility] and transferred to [facility] for nursing care and therapies. She has progressed nicely.  Consults: None  Significant Diagnostic Studies: radiology: X-Ray: intra-operative  Treatments: surgery: POSTERIOR LUMBAR FUSION 4 LEVEL (Bilateral) - Lumbar two-three, three-four, four-five, lumbar five sacral one Redo decompression/Fusion with pedicle screw fixation and posterolateral arthrodesis   Discharge Exam: Blood pressure 107/52, pulse 77, temperature 98.4 F (36.9 C), temperature source Oral, resp. rate 17, height 5\' 2"  (1.575 m), weight 76.1 kg (167 lb 12.3 oz), SpO2 95.00%. Alert, conversant. Good strength BLE. Pain well controlled with po meds. Hemovac emptied last night ~153ml - pulled. Incision without erythema, swelling, drainage. Steri's intact.     Disposition: 01-Home or Self Care  Pt has RW & bath chair at home. Rx's to be given: Percocet & Robaxin. Pt  verbalizes understanding of d/c instructions & agrees to call office to schedule f/u appt with Dr. Venetia Maxon for 3-4 wks.      Medication List     As of 12/29/2011 10:40 AM    ASK your doctor about these medications         ALPRAZolam 1 MG tablet   Commonly known as: XANAX   Take 1 mg by mouth 3 (three) times daily as needed. For anxiety.      glimepiride 4 MG tablet   Commonly known as: AMARYL   Take 8 mg by mouth daily before breakfast.      HYDROcodone-acetaminophen 10-325 MG per tablet   Commonly known as: NORCO   Take 1 tablet by mouth every 6 (six) hours as needed.      metoprolol 100 MG tablet   Commonly known as: LOPRESSOR   Take 100 mg by mouth 2 (two) times daily.      nystatin cream   Commonly known as: MYCOSTATIN   Apply 1 application topically at bedtime. For chronic yeast infection.      traZODone 50 MG tablet   Commonly known as: DESYREL   Take 50 mg by mouth at bedtime.         Signed: Georgiann Cocker 12/29/2011, 10:40 AM

## 2011-12-29 NOTE — Care Management Note (Signed)
    Page 1 of 1   12/29/2011     2:00:06 PM   CARE MANAGEMENT NOTE 12/29/2011  Patient:  Michelle Crane, Michelle Crane   Account Number:  [id number]  Date Initiated:  12/28/2011  Documentation initiated by:  Jacquelynn Cree  Subjective/Objective Assessment:   Admitted postop L2-5 PLIF     Action/Plan:   PT/OT evals-no follow up needs, no equipment needs   Anticipated DC Date:  12/29/2011   Anticipated DC Plan:  HOME/SELF CARE      DC Planning Services  CM consult      Choice offered to / List presented to:             Status of service:  Completed, signed off Medicare Important Message given?   (If response is "NO", the following Medicare IM given date fields will be blank) Date Medicare IM given:   Date Additional Medicare IM given:    Discharge Disposition:  HOME/SELF CARE  Per UR Regulation:  Reviewed for med. necessity/level of care/duration of stay  If discussed at Long Length of Stay Meetings, dates discussed:    Comments:

## 2011-12-29 NOTE — Progress Notes (Signed)
Subjective: Patient reports "Can I go home?"  "I don't have much pain."  Objective: Vital signs in last 24 hours: Temp:  [97.2 F (36.2 C)-99.4 F (37.4 C)] 98.4 F (36.9 C) (12/05 1000) Pulse Rate:  [68-83] 77  (12/05 1000) Resp:  [17-18] 17  (12/05 1000) BP: (100-109)/(46-62) 107/52 mmHg (12/05 1000) SpO2:  [95 %-100 %] 95 % (12/05 1000) Weight:  [76.1 kg (167 lb 12.3 oz)] 76.1 kg (167 lb 12.3 oz) (12/04 2207)  Intake/Output from previous day: 12/04 0701 - 12/05 0700 In: -  Out: 150 [Drains:150] Intake/Output this shift:    Alert, conversant. Good strength BLE. Pain well controlled with po meds. Hemovac emptied last night ~157ml - pulled. Incision without erythema, swelling, drainage. Steri's intact.   Lab Results: No results found for this basename: WBC:2,HGB:2,HCT:2,PLT:2 in the last 72 hours BMET No results found for this basename: NA:2,K:2,CL:2,CO2:2,GLUCOSE:2,BUN:2,CREATININE:2,CALCIUM:2 in the last 72 hours  Studies/Results: Dg Lumbar Spine 2-3 Views  12/27/2011  *RADIOLOGY REPORT*  Clinical Data: L2 - S1 fusion  DG C-ARM 61-120 MIN,LUMBAR SPINE - 2-3 VIEW  Comparison: Lumbar spine radiographs - 11/07/2011  Findings:  A total of six spot intraoperative fluoroscopic images of the lower lumbar spine are submitted for review.  Lumbar spine is in keeping with preprocedural lumbar spine imaging.  Provided images demonstrate bilateral pedicle screws extending from L2-S1.  Intervertebral disc space replacements are seen at the intervening disc space levels.  There is marked improved alignment of the previously noted anterolisthesis of L4 upon L5.  No definite radiopaque foreign body.  IMPRESSION: Intraoperative spinal localization as above.   Original Report Authenticated By: Tacey Ruiz, MD    Dg C-arm [phone] Min  12/27/2011  *RADIOLOGY REPORT*  Clinical Data: L2 - S1 fusion  DG C-ARM 61-120 MIN,LUMBAR SPINE - 2-3 VIEW  Comparison: Lumbar spine radiographs - 11/07/2011  Findings:   A total of six spot intraoperative fluoroscopic images of the lower lumbar spine are submitted for review.  Lumbar spine is in keeping with preprocedural lumbar spine imaging.  Provided images demonstrate bilateral pedicle screws extending from L2-S1.  Intervertebral disc space replacements are seen at the intervening disc space levels.  There is marked improved alignment of the previously noted anterolisthesis of L4 upon L5.  No definite radiopaque foreign body.  IMPRESSION: Intraoperative spinal localization as above.   Original Report Authenticated By: Tacey Ruiz, MD     Assessment/Plan: Improving  LOS: 2 days  Per Dr. Venetia Maxon, d/c IV; d/c to home. No need for HHPT/OT. Pt has RW & bath chair at home. Rx's to be given: Percocet & Robaxin. Pt verbalizes understanding of d/c instructions & agrees to call office to schedule f/u appt with Dr. Venetia Maxon for 3-4 wks.   Georgiann Cocker 12/29/2011, 10:34 AM

## 2011-12-29 NOTE — Progress Notes (Signed)
AV S and discharged instruction given   IV SL D/c intact, pt verbalized understanding by 2-3 teach me back . Pain under control awaiting  husband to pick her up

## 2011-12-29 NOTE — Progress Notes (Signed)
Occupational Therapy Treatment Patient Details Name: Michelle Crane MRN: [id number] DOB: August 23, 1955 Today's Date: 12/29/2011 Time: 9629-5284 OT Time Calculation (min): 36 min  OT Assessment / Plan / Recommendation Comments on Treatment Session Pt making steady progress.  Currently supervision for simulated selfcare with use of AE and DME as needed.  Does not currently have any further OT needs.      Follow Up Recommendations  No OT follow up       Equipment Recommendations  None recommended by OT       Frequency Min 2X/week   Plan Discharge plan remains appropriate    Precautions / Restrictions Precautions Precautions: Back Required Braces or Orthoses: Spinal Brace Spinal Brace: Applied in sitting position Restrictions Weight Bearing Restrictions: No   Pertinent Vitals/Pain Pt reports pain 2/10, repositioned and performed mobility    ADL  Upper Body Dressing: Performed;Set up;Other (comment) (including back corset) Where Assessed - Upper Body Dressing: Unsupported sitting Lower Body Dressing: Performed;Set up;Other (comment) (with use of reacher and sockaide) Where Assessed - Lower Body Dressing: Supported sit to Pharmacist, hospital: Buyer, retail Method: Other (comment) (ambulating with the RW) Toilet Transfer Equipment: Raised toilet seat with arms (or 3-in-1 over toilet) Tub/Shower Transfer: Simulated;Supervision/safety Tub/Shower Transfer Method: Ambulating;Other (comment) (performed anterior posterior stepping over simulated edge.) Equipment Used: Rolling walker;Back brace Transfers/Ambulation Related to ADLs: Pt is overall supervision for mobility.  Does exhbit some trunk flexion with mobility. ADL Comments: Pt able to transfer supine to sit EOB with supervision and increased time using bed rail.  Able to state 2/3 back precautions, needed min subtle cuing to state the 3rd.  Able to donn back corset EOB with close supervision and min  instructional cueing as well.        OT Goals ADL Goals ADL Goal: Lower Body Dressing - Progress: Met ADL Goal: Toilet Transfer - Progress: Progressing toward goals ADL Goal: Toileting - Clothing Manipulation - Progress: Progressing toward goals ADL Goal: Toileting - Hygiene - Progress: Progressing toward goals ADL Goal: Tub/Shower Transfer - Progress: Met Miscellaneous OT Goals OT Goal: Miscellaneous Goal #1 - Progress: Met OT Goal: Miscellaneous Goal #2 - Progress: Progressing toward goals OT Goal: Miscellaneous Goal #3 - Progress: Progressing toward goals  Visit Information  Last OT Received On: 12/29/11 Assistance Needed: +1    Subjective Data  Subjective: I hope I get to go home today. Patient Stated Goal: To go home as soon as possible.      Cognition  Overall Cognitive Status: Appears within functional limits for tasks assessed/performed Arousal/Alertness: Awake/alert Orientation Level: Appears intact for tasks assessed Behavior During Session: [facility] for tasks performed    Mobility  Shoulder Instructions Bed Mobility Bed Mobility: Rolling Left;Left Sidelying to Sit Rolling Left: 5: Supervision;With rail Left Sidelying to Sit: 5: Supervision;With rails Transfers Transfers: Sit to Stand Sit to Stand: 5: Supervision;From bed;Without upper extremity assist Stand to Sit: 5: Supervision;Without upper extremity assist;To bed        Balance Balance Balance Assessed: Yes Static Standing Balance Static Standing - Balance Support: Right upper extremity supported;Left upper extremity supported Static Standing - Level of Assistance: 5: Stand by assistance   End of Session OT - End of Session Activity Tolerance: Patient tolerated treatment well Patient left: in bed;with call bell/phone within reach Nurse Communication: Mobility status     Oriya Kettering OTR/L Pager number [phone] 12/29/2011, 9:06 AM

## 2011-12-29 NOTE — Progress Notes (Signed)
Patient doing extremely well.  D/C home 

## 2011-12-30 NOTE — Discharge Summary (Signed)
Patient doing extremely well.  D/C home

## 2012-01-03 MED FILL — Sodium Chloride IV Soln 0.9%: INTRAVENOUS | Qty: 1000 | Status: AC

## 2012-01-03 MED FILL — Heparin Sodium (Porcine) Inj 1000 Unit/ML: INTRAMUSCULAR | Qty: 30 | Status: AC

## 2012-01-03 MED FILL — Sodium Chloride Irrigation Soln 0.9%: Qty: 3000 | Status: AC

## 2013-09-05 IMAGING — CR DG LUMBAR SPINE 1V
1 series · 1 of 1 positions shown · non-contrast
Comparison: Lumbar MRI 11/28/2011.  Radiographs 11/07/2011 from
[REDACTED].

CLINICAL DATA: L2-S1 fusion.

LUMBAR SPINE - 1 VIEW

[view not recorded]
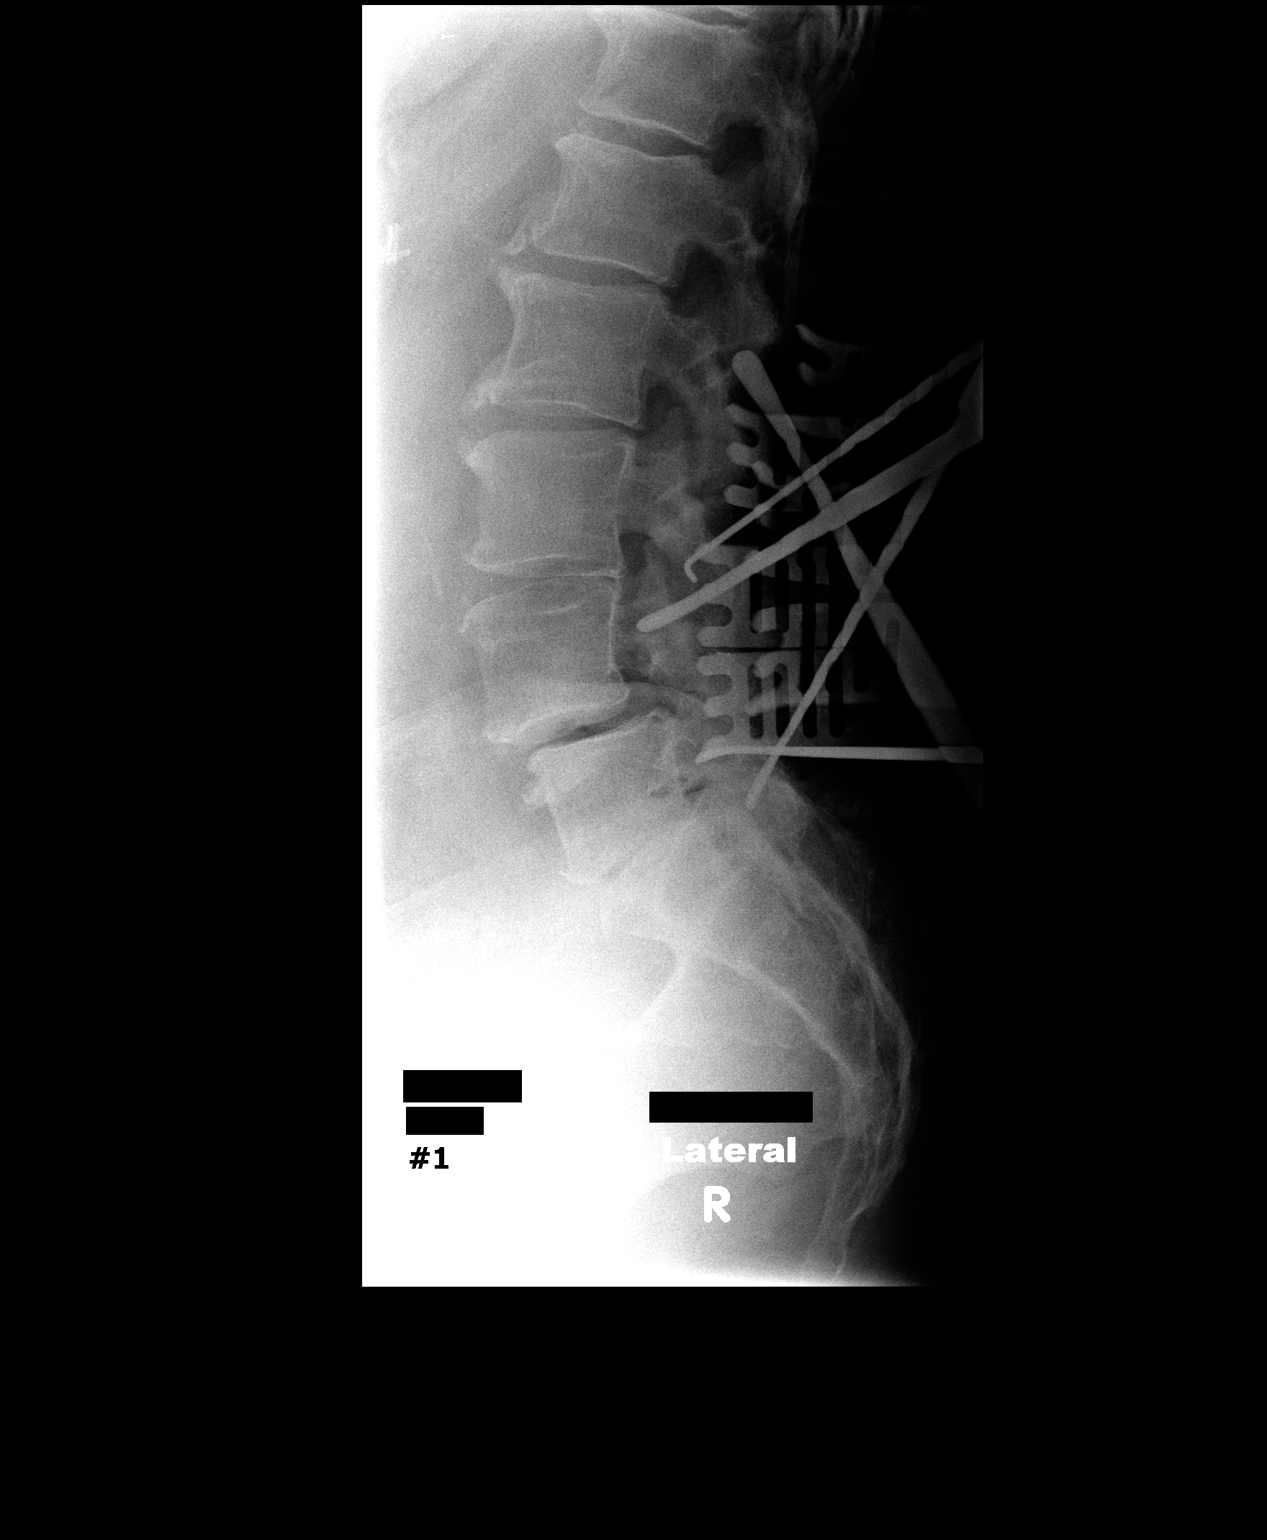

[1 of 1 positions shown; findings below may reference images not displayed]

FINDINGS: Cross-table lateral view labeled #1 at 6376 hours.  There
is a degenerative anterolisthesis at L4-L5.  There are skin
spreaders extending from L2-L5.  Multiple surgical instruments
overlie the posterior elements extending from the L2 through the S1
levels.
IMPRESSION: Intraoperative localization views as described.

## 2013-09-19 ENCOUNTER — Other Ambulatory Visit: Payer: Self-pay | Admitting: Orthopedic Surgery

## 2013-10-31 ENCOUNTER — Inpatient Hospital Stay (HOSPITAL_COMMUNITY): Admission: RE | Admit: 2013-10-31 | Payer: Medicare Other | Source: Ambulatory Visit

## 2014-01-21 NOTE — Pre-Procedure Instructions (Addendum)
Michelle Crane  01/21/2014   Your procedure is scheduled on: Tuesday, Jan. 12th   Report to [facility] Admitting at  5:30 AM.  Call this number if you have problems the morning of surgery: [phone]   Remember:   Do not eat food or drink liquids after midnight Monday.   Take these medicines the morning of surgery with A SIP OF WATER: Xanax, Celexa, Omeprazole, Systane, Norco, Metoprolol   STOP/ Do not take Aspirin, Aleve, Naproxen, Advil, Ibuprofen, Motrin, Vitamins, Herbs, or Supplements starting January 5   Do not wear jewelry, make-up or nail polish.  Do not wear lotions, powders, or perfumes. You may wear deodorant the morning of surgery.  Do not shave underarms & legs 48 hours prior to surgery.   Do not bring valuables to the hospital.  [facility] is not responsible for any belongings or valuables.               Contacts, dentures or bridgework may not be worn into surgery.  Leave suitcase in the car. After surgery it may be brought to your room.  For patients admitted to the hospital, discharge time is determined by your treatment team.    Name and phone number of your driver:    Special Instructions: "Preparing for Surgery" instruction sheet.   Please read over the following fact sheets that you were given: Pain Booklet, Coughing and Deep Breathing, MRSA Information and Surgical Site Infection Prevention

## 2014-01-22 ENCOUNTER — Encounter (HOSPITAL_COMMUNITY)
Admission: RE | Admit: 2014-01-22 | Discharge: 2014-01-22 | Disposition: A | Discharge status: Home / Self Care | Payer: Medicare Other | Source: Ambulatory Visit | Attending: Orthopedic Surgery | Admitting: Orthopedic Surgery

## 2014-01-22 ENCOUNTER — Encounter (HOSPITAL_COMMUNITY): Payer: Self-pay

## 2014-01-22 DIAGNOSIS — Z01818 Encounter for other preprocedural examination: Secondary | ICD-10-CM | POA: Diagnosis not present

## 2014-01-22 DIAGNOSIS — Z7901 Long term (current) use of anticoagulants: Secondary | ICD-10-CM | POA: Insufficient documentation

## 2014-01-22 DIAGNOSIS — M1712 Unilateral primary osteoarthritis, left knee: Secondary | ICD-10-CM | POA: Insufficient documentation

## 2014-01-22 DIAGNOSIS — I10 Essential (primary) hypertension: Secondary | ICD-10-CM | POA: Insufficient documentation

## 2014-01-22 HISTORY — DX: Type 2 diabetes mellitus without complications: E11.9

## 2014-01-22 LAB — PROTIME-INR
INR: 1.02 (ref 0.00–1.49)
Prothrombin Time: 13.5 seconds (ref 11.6–15.2)

## 2014-01-22 LAB — APTT: aPTT: 33 seconds (ref 24–37)

## 2014-01-22 LAB — BASIC METABOLIC PANEL
Anion gap: 11 (ref 5–15)
BUN: 12 mg/dL (ref 6–23)
CALCIUM: 9.5 mg/dL (ref 8.4–10.5)
CO2: 29 mmol/L (ref 19–32)
CREATININE: 0.72 mg/dL (ref 0.50–1.10)
Chloride: 97 mEq/L (ref 96–112)
GFR calc Af Amer: >90 mL/min (ref >90)
GLUCOSE: 205 mg/dL — AB (ref 70–99)
Potassium: 4.4 mmol/L (ref 3.5–5.1)
Sodium: 137 mmol/L (ref 135–145)

## 2014-01-22 LAB — CBC
HEMATOCRIT: 41.5 % (ref 36.0–46.0)
Hemoglobin: 13.6 g/dL (ref 12.0–15.0)
MCH: 30.2 pg (ref 26.0–34.0)
MCHC: 32.8 g/dL (ref 30.0–36.0)
MCV: 92 fL (ref 78.0–100.0)
Platelets: 178 10*3/uL (ref 150–400)
RBC: 4.51 MIL/uL (ref 3.87–5.11)
RDW: 13 % (ref 11.5–15.5)
WBC: 8.2 10*3/uL (ref 4.0–10.5)

## 2014-01-22 LAB — SURGICAL PCR SCREEN
MRSA, PCR: NEGATIVE
Staphylococcus aureus: NEGATIVE

## 2014-01-22 NOTE — Progress Notes (Signed)
Patient denies CP, shob, or cardiac test; PCP Dr. Philipp Deputy in [place], [place]

## 2014-02-03 MED ORDER — CEFAZOLIN SODIUM-DEXTROSE 2-3 GM-% IV SOLR
2.0000 g | INTRAVENOUS | Status: AC
  Administered 2014-02-04: 2 g via INTRAVENOUS

## 2014-02-04 ENCOUNTER — Inpatient Hospital Stay (HOSPITAL_COMMUNITY): Payer: Medicare Other

## 2014-02-04 ENCOUNTER — Inpatient Hospital Stay (HOSPITAL_COMMUNITY)
Admission: RE | Admit: 2014-02-04 | Discharge: 2014-02-07 | DRG: 470 | Disposition: A | Discharge status: Home Health | Payer: Medicare Other | Source: Ambulatory Visit | Attending: Orthopedic Surgery | Admitting: Orthopedic Surgery

## 2014-02-04 ENCOUNTER — Inpatient Hospital Stay (HOSPITAL_COMMUNITY): Payer: Medicare Other | Admitting: Certified Registered Nurse Anesthetist

## 2014-02-04 ENCOUNTER — Encounter (HOSPITAL_COMMUNITY)
Admission: RE | Disposition: A | Discharge status: Home Health | Payer: Self-pay | Source: Ambulatory Visit | Attending: Orthopedic Surgery

## 2014-02-04 ENCOUNTER — Encounter (HOSPITAL_COMMUNITY): Payer: Self-pay | Admitting: *Deleted

## 2014-02-04 DIAGNOSIS — E785 Hyperlipidemia, unspecified: Secondary | ICD-10-CM | POA: Diagnosis present

## 2014-02-04 DIAGNOSIS — F329 Major depressive disorder, single episode, unspecified: Secondary | ICD-10-CM | POA: Diagnosis present

## 2014-02-04 DIAGNOSIS — K219 Gastro-esophageal reflux disease without esophagitis: Secondary | ICD-10-CM | POA: Diagnosis present

## 2014-02-04 DIAGNOSIS — G4700 Insomnia, unspecified: Secondary | ICD-10-CM | POA: Diagnosis present

## 2014-02-04 DIAGNOSIS — R509 Fever, unspecified: Secondary | ICD-10-CM

## 2014-02-04 DIAGNOSIS — E119 Type 2 diabetes mellitus without complications: Secondary | ICD-10-CM | POA: Diagnosis present

## 2014-02-04 DIAGNOSIS — F419 Anxiety disorder, unspecified: Secondary | ICD-10-CM | POA: Diagnosis present

## 2014-02-04 DIAGNOSIS — Z7901 Long term (current) use of anticoagulants: Secondary | ICD-10-CM | POA: Diagnosis not present

## 2014-02-04 DIAGNOSIS — I10 Essential (primary) hypertension: Secondary | ICD-10-CM | POA: Diagnosis present

## 2014-02-04 DIAGNOSIS — G8929 Other chronic pain: Secondary | ICD-10-CM | POA: Diagnosis present

## 2014-02-04 DIAGNOSIS — Z79899 Other long term (current) drug therapy: Secondary | ICD-10-CM | POA: Diagnosis not present

## 2014-02-04 DIAGNOSIS — Z96652 Presence of left artificial knee joint: Secondary | ICD-10-CM

## 2014-02-04 DIAGNOSIS — M1712 Unilateral primary osteoarthritis, left knee: Principal | ICD-10-CM

## 2014-02-04 DIAGNOSIS — M1710 Unilateral primary osteoarthritis, unspecified knee: Secondary | ICD-10-CM | POA: Diagnosis present

## 2014-02-04 DIAGNOSIS — M179 Osteoarthritis of knee, unspecified: Secondary | ICD-10-CM | POA: Diagnosis present

## 2014-02-04 HISTORY — DX: Unilateral primary osteoarthritis, left knee: M17.12

## 2014-02-04 HISTORY — PX: TOTAL KNEE ARTHROPLASTY: SHX125

## 2014-02-04 LAB — GLUCOSE, CAPILLARY
GLUCOSE-CAPILLARY: 219 mg/dL — AB (ref 70–99)
GLUCOSE-CAPILLARY: 220 mg/dL — AB (ref 70–99)
Glucose-Capillary: 220 mg/dL — ABNORMAL HIGH (ref 70–99)
Glucose-Capillary: 247 mg/dL — ABNORMAL HIGH (ref 70–99)
Glucose-Capillary: 253 mg/dL — ABNORMAL HIGH (ref 70–99)

## 2014-02-04 SURGERY — ARTHROPLASTY, KNEE, TOTAL
Anesthesia: General | Site: Knee | Laterality: Left

## 2014-02-04 MED ORDER — INSULIN ASPART PROT & ASPART (70-30 MIX) 100 UNIT/ML ~~LOC~~ SUSP: 6.0000 [IU] | Freq: Once | SUBCUTANEOUS | Status: DC

## 2014-02-04 MED ORDER — METOPROLOL TARTRATE 100 MG PO TABS
100.0000 mg | ORAL_TABLET | Freq: Two times a day (BID) | ORAL | Status: DC
  Administered 2014-02-04 – 2014-02-07 (×6): 100 mg via ORAL
  Filled 2014-02-04 (×8): qty 1

## 2014-02-04 MED ORDER — METOCLOPRAMIDE HCL 5 MG/ML IJ SOLN: 5.0000 mg | Freq: Three times a day (TID) | INTRAMUSCULAR | Status: DC | PRN

## 2014-02-04 MED ORDER — SODIUM CHLORIDE 0.9 % IJ SOLN
INTRAMUSCULAR | Status: AC
  Filled 2014-02-04: qty 10

## 2014-02-04 MED ORDER — LIDOCAINE HCL (CARDIAC) 20 MG/ML IV SOLN
INTRAVENOUS | Status: AC
  Filled 2014-02-04: qty 10

## 2014-02-04 MED ORDER — HYDROMORPHONE HCL 1 MG/ML IJ SOLN
1.0000 mg | INTRAMUSCULAR | Status: DC | PRN
  Administered 2014-02-04 – 2014-02-05 (×2): 1 mg via INTRAVENOUS
  Administered 2014-02-05: 0.5 mg via INTRAVENOUS
  Administered 2014-02-06: 1 mg via INTRAVENOUS
  Filled 2014-02-04 (×4): qty 1

## 2014-02-04 MED ORDER — FENTANYL CITRATE 0.05 MG/ML IJ SOLN
INTRAMUSCULAR | Status: AC
  Filled 2014-02-04: qty 5

## 2014-02-04 MED ORDER — SENNA 8.6 MG PO TABS
1.0000 | ORAL_TABLET | Freq: Two times a day (BID) | ORAL | Status: DC
  Administered 2014-02-04 – 2014-02-07 (×6): 8.6 mg via ORAL
  Filled 2014-02-04 (×8): qty 1

## 2014-02-04 MED ORDER — TRAZODONE HCL 50 MG PO TABS
50.0000 mg | ORAL_TABLET | Freq: Every day | ORAL | Status: DC
  Administered 2014-02-04 – 2014-02-06 (×3): 50 mg via ORAL
  Filled 2014-02-04 (×4): qty 1

## 2014-02-04 MED ORDER — KETOROLAC TROMETHAMINE 15 MG/ML IJ SOLN
INTRAMUSCULAR | Status: AC
  Filled 2014-02-04: qty 1

## 2014-02-04 MED ORDER — ONDANSETRON HCL 4 MG/2ML IJ SOLN
INTRAMUSCULAR | Status: DC | PRN
  Administered 2014-02-04 (×2): 4 mg via INTRAVENOUS

## 2014-02-04 MED ORDER — SODIUM CHLORIDE 0.9 % IR SOLN
Status: DC | PRN
  Administered 2014-02-04: 3000 mL

## 2014-02-04 MED ORDER — POLYETHYLENE GLYCOL 3350 17 G PO PACK
17.0000 g | PACK | Freq: Every day | ORAL | Status: DC | PRN
  Administered 2014-02-06: 17 g via ORAL
  Filled 2014-02-04: qty 1

## 2014-02-04 MED ORDER — RIVAROXABAN 10 MG PO TABS: 10.0000 mg | ORAL_TABLET | Freq: Every day | ORAL | Status: DC

## 2014-02-04 MED ORDER — METHOCARBAMOL 1000 MG/10ML IJ SOLN
500.0000 mg | Freq: Four times a day (QID) | INTRAMUSCULAR | Status: DC | PRN
  Administered 2014-02-04 (×2): 500 mg via INTRAVENOUS
  Filled 2014-02-04 (×2): qty 5

## 2014-02-04 MED ORDER — PANTOPRAZOLE SODIUM 40 MG PO TBEC
80.0000 mg | DELAYED_RELEASE_TABLET | Freq: Every day | ORAL | Status: DC
  Administered 2014-02-05 – 2014-02-07 (×3): 80 mg via ORAL
  Filled 2014-02-04 (×3): qty 2

## 2014-02-04 MED ORDER — ONDANSETRON HCL 4 MG PO TABS: 4.0000 mg | ORAL_TABLET | Freq: Three times a day (TID) | ORAL | Status: DC | PRN

## 2014-02-04 MED ORDER — MENTHOL 3 MG MT LOZG: 1.0000 | LOZENGE | OROMUCOSAL | Status: DC | PRN

## 2014-02-04 MED ORDER — DIPHENHYDRAMINE HCL 12.5 MG/5ML PO ELIX: 12.5000 mg | ORAL_SOLUTION | ORAL | Status: DC | PRN

## 2014-02-04 MED ORDER — HYDROMORPHONE HCL 1 MG/ML IJ SOLN
INTRAMUSCULAR | Status: AC
  Filled 2014-02-04: qty 1

## 2014-02-04 MED ORDER — OXYCODONE HCL 5 MG PO TABS
5.0000 mg | ORAL_TABLET | ORAL | Status: DC | PRN
  Administered 2014-02-04: 10 mg via ORAL
  Administered 2014-02-04: 5 mg via ORAL
  Administered 2014-02-04 – 2014-02-07 (×15): 10 mg via ORAL
  Filled 2014-02-04 (×16): qty 2

## 2014-02-04 MED ORDER — ONDANSETRON HCL 4 MG/2ML IJ SOLN
4.0000 mg | Freq: Four times a day (QID) | INTRAMUSCULAR | Status: DC | PRN
  Administered 2014-02-04: 4 mg via INTRAVENOUS
  Filled 2014-02-04: qty 2

## 2014-02-04 MED ORDER — LIDOCAINE HCL (CARDIAC) 20 MG/ML IV SOLN
INTRAVENOUS | Status: DC | PRN
  Administered 2014-02-04: 40 mg via INTRAVENOUS

## 2014-02-04 MED ORDER — SENNA-DOCUSATE SODIUM 8.6-50 MG PO TABS: 2.0000 | ORAL_TABLET | Freq: Every day | ORAL | Status: DC

## 2014-02-04 MED ORDER — DOCUSATE SODIUM 100 MG PO CAPS
100.0000 mg | ORAL_CAPSULE | Freq: Two times a day (BID) | ORAL | Status: DC
  Administered 2014-02-04 – 2014-02-07 (×5): 100 mg via ORAL
  Filled 2014-02-04 (×7): qty 1

## 2014-02-04 MED ORDER — PROPOFOL 10 MG/ML IV BOLUS
INTRAVENOUS | Status: AC
  Filled 2014-02-04: qty 20

## 2014-02-04 MED ORDER — CEFAZOLIN SODIUM-DEXTROSE 2-3 GM-% IV SOLR
2.0000 g | Freq: Four times a day (QID) | INTRAVENOUS | Status: AC
  Administered 2014-02-04 (×2): 2 g via INTRAVENOUS
  Filled 2014-02-04 (×2): qty 50

## 2014-02-04 MED ORDER — METHOCARBAMOL 500 MG PO TABS: 500.0000 mg | ORAL_TABLET | Freq: Four times a day (QID) | ORAL | Status: DC | PRN

## 2014-02-04 MED ORDER — LACTATED RINGERS IV SOLN
INTRAVENOUS | Status: DC | PRN
  Administered 2014-02-04 (×2): via INTRAVENOUS

## 2014-02-04 MED ORDER — EPHEDRINE SULFATE 50 MG/ML IJ SOLN
INTRAMUSCULAR | Status: DC | PRN
  Administered 2014-02-04 (×2): 5 mg via INTRAVENOUS

## 2014-02-04 MED ORDER — ONDANSETRON HCL 4 MG/2ML IJ SOLN: 4.0000 mg | Freq: Once | INTRAMUSCULAR | Status: DC | PRN

## 2014-02-04 MED ORDER — ALPRAZOLAM 0.5 MG PO TABS
1.0000 mg | ORAL_TABLET | Freq: Three times a day (TID) | ORAL | Status: DC | PRN
  Administered 2014-02-04 – 2014-02-06 (×5): 1 mg via ORAL
  Filled 2014-02-04 (×6): qty 2

## 2014-02-04 MED ORDER — OXYCODONE HCL 5 MG PO TABS
ORAL_TABLET | ORAL | Status: AC
  Filled 2014-02-04: qty 1

## 2014-02-04 MED ORDER — ONDANSETRON HCL 4 MG/2ML IJ SOLN
INTRAMUSCULAR | Status: AC
  Filled 2014-02-04: qty 2

## 2014-02-04 MED ORDER — CEFAZOLIN SODIUM-DEXTROSE 2-3 GM-% IV SOLR
INTRAVENOUS | Status: AC
Start: 2014-02-04 — End: 2014-02-04
  Filled 2014-02-04: qty 50

## 2014-02-04 MED ORDER — BISACODYL 10 MG RE SUPP: 10.0000 mg | Freq: Every day | RECTAL | Status: DC | PRN

## 2014-02-04 MED ORDER — NYSTATIN 100000 UNIT/GM EX CREA
1.0000 "application " | TOPICAL_CREAM | Freq: Every day | CUTANEOUS | Status: DC
  Administered 2014-02-04 – 2014-02-06 (×3): 1 via TOPICAL
  Filled 2014-02-04: qty 15

## 2014-02-04 MED ORDER — HYDRALAZINE HCL 20 MG/ML IJ SOLN
INTRAMUSCULAR | Status: AC
  Filled 2014-02-04: qty 1

## 2014-02-04 MED ORDER — POLYVINYL ALCOHOL 1.4 % OP SOLN
1.0000 [drp] | Freq: Two times a day (BID) | OPHTHALMIC | Status: DC
  Filled 2014-02-04: qty 15

## 2014-02-04 MED ORDER — EPHEDRINE SULFATE 50 MG/ML IJ SOLN
INTRAMUSCULAR | Status: AC
  Filled 2014-02-04: qty 1

## 2014-02-04 MED ORDER — SODIUM CHLORIDE 0.9 % IR SOLN
Status: DC | PRN
  Administered 2014-02-04: 1000 mL

## 2014-02-04 MED ORDER — MIDAZOLAM HCL 2 MG/2ML IJ SOLN
INTRAMUSCULAR | Status: AC
  Filled 2014-02-04: qty 2

## 2014-02-04 MED ORDER — METHOCARBAMOL 500 MG PO TABS
500.0000 mg | ORAL_TABLET | Freq: Four times a day (QID) | ORAL | Status: DC | PRN
  Administered 2014-02-04 – 2014-02-07 (×6): 500 mg via ORAL
  Filled 2014-02-04 (×6): qty 1

## 2014-02-04 MED ORDER — KETOROLAC TROMETHAMINE 15 MG/ML IJ SOLN
7.5000 mg | Freq: Four times a day (QID) | INTRAMUSCULAR | Status: AC
  Administered 2014-02-04 – 2014-02-05 (×3): 7.5 mg via INTRAVENOUS
  Filled 2014-02-04: qty 1

## 2014-02-04 MED ORDER — INSULIN ASPART 100 UNIT/ML ~~LOC~~ SOLN
SUBCUTANEOUS | Status: DC | PRN
  Administered 2014-02-04: 10 [IU] via SUBCUTANEOUS

## 2014-02-04 MED ORDER — METOCLOPRAMIDE HCL 10 MG PO TABS: 5.0000 mg | ORAL_TABLET | Freq: Three times a day (TID) | ORAL | Status: DC | PRN

## 2014-02-04 MED ORDER — DEXAMETHASONE SODIUM PHOSPHATE 4 MG/ML IJ SOLN
INTRAMUSCULAR | Status: AC
  Filled 2014-02-04: qty 1

## 2014-02-04 MED ORDER — SUCCINYLCHOLINE CHLORIDE 20 MG/ML IJ SOLN
INTRAMUSCULAR | Status: AC
  Filled 2014-02-04: qty 1

## 2014-02-04 MED ORDER — PHENYLEPHRINE HCL 10 MG/ML IJ SOLN
INTRAMUSCULAR | Status: DC | PRN
  Administered 2014-02-04: 200 ug via INTRAVENOUS
  Administered 2014-02-04 (×3): 40 ug via INTRAVENOUS

## 2014-02-04 MED ORDER — INSULIN ASPART 100 UNIT/ML ~~LOC~~ SOLN
SUBCUTANEOUS | Status: AC
  Administered 2014-02-04: 6 [IU]
  Filled 2014-02-04: qty 6

## 2014-02-04 MED ORDER — HYDROMORPHONE HCL 1 MG/ML IJ SOLN
0.2500 mg | INTRAMUSCULAR | Status: DC | PRN
  Administered 2014-02-04 (×3): 0.5 mg via INTRAVENOUS

## 2014-02-04 MED ORDER — ACETAMINOPHEN 650 MG RE SUPP: 650.0000 mg | Freq: Four times a day (QID) | RECTAL | Status: DC | PRN

## 2014-02-04 MED ORDER — PHENOL 1.4 % MT LIQD: 1.0000 | OROMUCOSAL | Status: DC | PRN

## 2014-02-04 MED ORDER — FENTANYL CITRATE 0.05 MG/ML IJ SOLN
INTRAMUSCULAR | Status: DC | PRN
  Administered 2014-02-04: 25 ug via INTRAVENOUS
  Administered 2014-02-04: 75 ug via INTRAVENOUS
  Administered 2014-02-04 (×12): 25 ug via INTRAVENOUS

## 2014-02-04 MED ORDER — POTASSIUM CHLORIDE IN NACL 20-0.45 MEQ/L-% IV SOLN
INTRAVENOUS | Status: DC
Start: 2014-02-04 — End: 2014-02-07
  Administered 2014-02-04: 20:00:00 via INTRAVENOUS
  Filled 2014-02-04 (×7): qty 1000

## 2014-02-04 MED ORDER — CITALOPRAM HYDROBROMIDE 20 MG PO TABS
20.0000 mg | ORAL_TABLET | Freq: Every day | ORAL | Status: DC
  Administered 2014-02-05 – 2014-02-07 (×3): 20 mg via ORAL
  Filled 2014-02-04 (×4): qty 1

## 2014-02-04 MED ORDER — PHENYLEPHRINE 40 MCG/ML (10ML) SYRINGE FOR IV PUSH (FOR BLOOD PRESSURE SUPPORT)
PREFILLED_SYRINGE | INTRAVENOUS | Status: AC
  Filled 2014-02-04: qty 10

## 2014-02-04 MED ORDER — GLYCOPYRROLATE 0.2 MG/ML IJ SOLN
INTRAMUSCULAR | Status: AC
  Filled 2014-02-04: qty 1

## 2014-02-04 MED ORDER — BUPIVACAINE HCL (PF) 0.25 % IJ SOLN
INTRAMUSCULAR | Status: AC
  Filled 2014-02-04: qty 30

## 2014-02-04 MED ORDER — GLYCOPYRROLATE 0.2 MG/ML IJ SOLN
INTRAMUSCULAR | Status: DC | PRN
  Administered 2014-02-04: .1 mg via INTRAVENOUS

## 2014-02-04 MED ORDER — ONDANSETRON HCL 4 MG PO TABS
4.0000 mg | ORAL_TABLET | Freq: Four times a day (QID) | ORAL | Status: DC | PRN
  Administered 2014-02-05: 4 mg via ORAL
  Filled 2014-02-04: qty 1

## 2014-02-04 MED ORDER — ROCURONIUM BROMIDE 50 MG/5ML IV SOLN
INTRAVENOUS | Status: AC
  Filled 2014-02-04: qty 1

## 2014-02-04 MED ORDER — MAGNESIUM CITRATE PO SOLN: 1.0000 | Freq: Once | ORAL | Status: AC | PRN

## 2014-02-04 MED ORDER — ALUM & MAG HYDROXIDE-SIMETH 200-200-20 MG/5ML PO SUSP: 30.0000 mL | ORAL | Status: DC | PRN

## 2014-02-04 MED ORDER — PROPOFOL 10 MG/ML IV BOLUS
INTRAVENOUS | Status: DC | PRN
  Administered 2014-02-04: 180 mg via INTRAVENOUS
  Administered 2014-02-04: 20 mg via INTRAVENOUS

## 2014-02-04 MED ORDER — RIVAROXABAN 10 MG PO TABS
10.0000 mg | ORAL_TABLET | Freq: Every day | ORAL | Status: DC
  Administered 2014-02-05 – 2014-02-07 (×3): 10 mg via ORAL
  Filled 2014-02-04 (×4): qty 1

## 2014-02-04 MED ORDER — METHOCARBAMOL 1000 MG/10ML IJ SOLN
500.0000 mg | INTRAVENOUS | Status: DC
  Filled 2014-02-04: qty 5

## 2014-02-04 MED ORDER — ACETAMINOPHEN 325 MG PO TABS
650.0000 mg | ORAL_TABLET | Freq: Four times a day (QID) | ORAL | Status: DC | PRN
  Administered 2014-02-05 – 2014-02-06 (×3): 650 mg via ORAL
  Filled 2014-02-04 (×3): qty 2

## 2014-02-04 MED ORDER — POLYETHYL GLYCOL-PROPYL GLYCOL 0.4-0.3 % OP SOLN: 1.0000 [drp] | Freq: Two times a day (BID) | OPHTHALMIC | Status: DC

## 2014-02-04 MED ORDER — OXYCODONE-ACETAMINOPHEN 10-325 MG PO TABS: 1.0000 | ORAL_TABLET | Freq: Four times a day (QID) | ORAL | Status: DC | PRN

## 2014-02-04 MED ORDER — METHOCARBAMOL 500 MG PO TABS
ORAL_TABLET | ORAL | Status: AC
  Filled 2014-02-04: qty 1

## 2014-02-04 MED ORDER — OXYCODONE HCL 5 MG PO TABS
ORAL_TABLET | ORAL | Status: AC
  Administered 2014-02-04: 5 mg
  Filled 2014-02-04: qty 1

## 2014-02-04 MED ORDER — ONDANSETRON HCL 4 MG/2ML IJ SOLN
INTRAMUSCULAR | Status: AC
  Filled 2014-02-04: qty 4

## 2014-02-04 SURGICAL SUPPLY — 60 items
BANDAGE ELASTIC 6 VELCRO ST LF (GAUZE/BANDAGES/DRESSINGS) ×2 IMPLANT
BANDAGE ESMARK 6X9 LF (GAUZE/BANDAGES/DRESSINGS) ×1 IMPLANT
BENZOIN TINCTURE PRP APPL 2/3 (GAUZE/BANDAGES/DRESSINGS) ×2 IMPLANT
BLADE SAG 18X100X1.27 (BLADE) ×2 IMPLANT
BLADE SAW RECIP 87.9 MT (BLADE) ×2 IMPLANT
BLADE SAW SGTL 13X75X1.27 (BLADE) ×2 IMPLANT
BNDG ESMARK 6X9 LF (GAUZE/BANDAGES/DRESSINGS) ×2
BOOTCOVER CLEANROOM LRG (PROTECTIVE WEAR) IMPLANT
BOWL SMART MIX CTS (DISPOSABLE) ×2 IMPLANT
CAP KNEE TOTAL 3 SIGMA ×2 IMPLANT
CEMENT HV SMART SET (Cement) ×4 IMPLANT
CLSR STERI-STRIP ANTIMIC 1/2X4 (GAUZE/BANDAGES/DRESSINGS) ×2 IMPLANT
COVER SURGICAL LIGHT HANDLE (MISCELLANEOUS) ×2 IMPLANT
CUFF TOURNIQUET SINGLE 34IN LL (TOURNIQUET CUFF) ×2 IMPLANT
DRAPE EXTREMITY T 121X128X90 (DRAPE) ×2 IMPLANT
DRAPE IMP U-DRAPE 54X76 (DRAPES) ×2 IMPLANT
DRAPE PROXIMA HALF (DRAPES) ×4 IMPLANT
DRAPE U-SHAPE 47X51 STRL (DRAPES) ×2 IMPLANT
DURAPREP 26ML APPLICATOR (WOUND CARE) ×2 IMPLANT
ELECT CAUTERY BLADE 6.4 (BLADE) ×2 IMPLANT
ELECT REM PT RETURN 9FT ADLT (ELECTROSURGICAL) ×2
ELECTRODE REM PT RTRN 9FT ADLT (ELECTROSURGICAL) ×1 IMPLANT
FACESHIELD WRAPAROUND (MASK) IMPLANT
GAUZE SPONGE 4X4 12PLY STRL (GAUZE/BANDAGES/DRESSINGS) ×2 IMPLANT
GLOVE BIOGEL PI IND STRL 8 (GLOVE) ×1 IMPLANT
GLOVE BIOGEL PI INDICATOR 8 (GLOVE) ×1
GLOVE BIOGEL PI ORTHO PRO SZ8 (GLOVE) ×1
GLOVE ORTHO TXT STRL SZ7.5 (GLOVE) ×2 IMPLANT
GLOVE PI ORTHO PRO STRL SZ8 (GLOVE) ×1 IMPLANT
GLOVE SURG ORTHO 8.0 STRL STRW (GLOVE) ×2 IMPLANT
GOWN STRL REUS W/ TWL XL LVL3 (GOWN DISPOSABLE) ×1 IMPLANT
GOWN STRL REUS W/TWL 2XL LVL3 (GOWN DISPOSABLE) ×2 IMPLANT
GOWN STRL REUS W/TWL XL LVL3 (GOWN DISPOSABLE) ×1
HANDPIECE INTERPULSE COAX TIP (DISPOSABLE) ×1
HOOD PEEL AWAY FACE SHEILD DIS (HOOD) ×6 IMPLANT
IMMOBILIZER KNEE 22 (SOFTGOODS) ×2 IMPLANT
KIT BASIN OR (CUSTOM PROCEDURE TRAY) ×2 IMPLANT
KIT ROOM TURNOVER OR (KITS) ×2 IMPLANT
MANIFOLD NEPTUNE II (INSTRUMENTS) ×2 IMPLANT
NS IRRIG 1000ML POUR BTL (IV SOLUTION) ×2 IMPLANT
PACK TOTAL JOINT (CUSTOM PROCEDURE TRAY) ×2 IMPLANT
PACK UNIVERSAL I (CUSTOM PROCEDURE TRAY) ×2 IMPLANT
PAD ABD 8X10 STRL (GAUZE/BANDAGES/DRESSINGS) ×2 IMPLANT
PAD ARMBOARD 7.5X6 YLW CONV (MISCELLANEOUS) ×2 IMPLANT
PAD CAST 4YDX4 CTTN HI CHSV (CAST SUPPLIES) IMPLANT
PADDING CAST COTTON 4X4 STRL (CAST SUPPLIES)
PADDING CAST COTTON 6X4 STRL (CAST SUPPLIES) ×2 IMPLANT
SET HNDPC FAN SPRY TIP SCT (DISPOSABLE) ×1 IMPLANT
SUCTION FRAZIER TIP 10 FR DISP (SUCTIONS) ×2 IMPLANT
SUT MNCRL AB 4-0 PS2 18 (SUTURE) ×2 IMPLANT
SUT VIC AB 0 CT1 27 (SUTURE) ×1
SUT VIC AB 0 CT1 27XBRD ANBCTR (SUTURE) ×1 IMPLANT
SUT VIC AB 2-0 CT1 27 (SUTURE) ×1
SUT VIC AB 2-0 CT1 TAPERPNT 27 (SUTURE) ×1 IMPLANT
SUT VIC AB 3-0 SH 8-18 (SUTURE) ×2 IMPLANT
SYR 30ML LL (SYRINGE) IMPLANT
TOWEL OR 17X24 6PK STRL BLUE (TOWEL DISPOSABLE) ×2 IMPLANT
TOWEL OR 17X26 10 PK STRL BLUE (TOWEL DISPOSABLE) ×2 IMPLANT
TRAY FOLEY CATH 16FRSI W/METER (SET/KITS/TRAYS/PACK) IMPLANT
WATER STERILE IRR 1000ML POUR (IV SOLUTION) IMPLANT

## 2014-02-04 NOTE — Anesthesia Preprocedure Evaluation (Signed)
Anesthesia Evaluation  Patient identified by MRN, date of birth, ID band Patient awake    Reviewed: Allergy & Precautions, NPO status , Patient's Chart, lab work & pertinent test results  Airway        Dental   Pulmonary          Cardiovascular hypertension,     Neuro/Psych  Headaches,  Neuromuscular disease    GI/Hepatic GERD-  ,  Endo/Other  diabetes, Type 2, Oral Hypoglycemic Agents  Renal/GU      Musculoskeletal   Abdominal   Peds  Hematology   Anesthesia Other Findings   Reproductive/Obstetrics                             Anesthesia Physical Anesthesia Plan  ASA: III  Anesthesia Plan: General   Post-op Pain Management:    Induction: Intravenous  Airway Management Planned: Oral ETT and LMA  Additional Equipment:   Intra-op Plan:   Post-operative Plan: Extubation in OR  Informed Consent: I have reviewed the patients History and Physical, chart, labs and discussed the procedure including the risks, benefits and alternatives for the proposed anesthesia with the patient or authorized representative who has indicated his/her understanding and acceptance.     Plan Discussed with: CRNA, Anesthesiologist and Surgeon  Anesthesia Plan Comments:         Anesthesia Quick Evaluation

## 2014-02-04 NOTE — Evaluation (Signed)
Physical Therapy Evaluation Patient Details Name: Michelle Crane MRN: [id number] DOB: 1955-08-06 Today's Date: 02/04/2014   History of Present Illness  Admitted for L TKA  Past Medical History  Diagnosis Date  . Complication of anesthesia   . PONV (postoperative nausea and vomiting)   . DDD (degenerative disc disease)   . Depression   . Chronic back pain   . GERD (gastroesophageal reflux disease)   . Carpal tunnel syndrome   . Carpal tunnel syndrome   . Hypertension     takes Lopressor daily  . Hyperlipidemia     doesn't take any meds for this;tries to control with diet  . Shortness of breath     with exertion  . Headache(784.0)     occasionally  . Joint pain   . Joint swelling   . Chronic back pain     spondylolisthesis/stenosis/radiculopathy/hnp  . Umbilical hernia     pt states that it doesn't bother her  . Urinary frequency   . History of bladder infections   . Yeast infection     history of;uses myccostatin cream prn  . Nocturia   . Diabetes mellitus     takes Amaryl daily  . Anxiety     takes Xanax prn  . Insomnia     takes trazodone nightly  . Type 2 diabetes mellitus   . Primary localized osteoarthritis of left knee 02/04/2014   Past Surgical History  Procedure Laterality Date  . Cholecystectomy    . Joint replacement  2011    rt total knee  . Carpal tunnel release  05/09/2011    Procedure: CARPAL TUNNEL RELEASE;  Surgeon: Tennis Must, MD;  Location: [facility];  Service: Orthopedics;  Laterality: Right;  . Tonsillectomy  at age 14  . Abdominal hysterectomy  77yrs ago  . Back surgery  [id number]  . Posterior lumbar fusion 4 level  12/27/2011    Procedure: POSTERIOR LUMBAR FUSION 4 LEVEL;  Surgeon: Erline Levine, MD;  Location: [facility];  Service: Neurosurgery;  Laterality: Bilateral;  Lumbar two-three, three-four, four-five, lumbar five sacral one Redo decompression/Fusion     Clinical Impression  Pt is s/p TKA resulting in the  deficits listed below (see PT Problem List).  Pt will benefit from skilled PT to increase their independence and safety with mobility to allow discharge to the venue listed below.      Follow Up Recommendations Home health PT;Supervision/Assistance - 24 hour    Equipment Recommendations  3in1 (PT)    Recommendations for Other Services       Precautions / Restrictions Precautions Precautions: Knee Precaution Comments: Pt educated to not allow any pillow or bolster under knee for healing with optimal range of motion.  Required Braces or Orthoses: Knee Immobilizer - Left Knee Immobilizer - Left: On when out of bed or walking (looks like there's an order to [place] on 1/13) Restrictions Weight Bearing Restrictions: Yes LLE Weight Bearing: Weight bearing as tolerated      Mobility  Bed Mobility Overal bed mobility: Needs Assistance Bed Mobility: Supine to Sit     Supine to sit: Min guard     General bed mobility comments: Cues for technqiue  Transfers Overall transfer level: Needs assistance Equipment used: Rolling walker (2 wheeled) Transfers: Sit to/from Stand Sit to Stand: Min assist         General transfer comment: Cues for technique, safety, and hand placement  Ambulation/Gait Ambulation/Gait assistance: Min assist;+2 safety/equipment (second person  pushing chair behind) Ambulation Distance (Feet): 15 Feet Assistive device: Rolling walker (2 wheeled) Gait Pattern/deviations: Step-through pattern     General Gait Details: Cues for gait sequence, and to activate L quad for stance stability  Stairs            Wheelchair Mobility    Modified Rankin (Stroke Patients Only)       Balance                                             Pertinent Vitals/Pain Pain Assessment: Faces Faces Pain Scale: Hurts little more Pain Location: L knee with flexion therex; also stated "my butt hurts"  Pain Descriptors / Indicators:  Grimacing;Sore Pain Intervention(s): Monitored during session;Repositioned    Home Living Family/patient expects to be discharged to:: Private residence Living Arrangements: Spouse/significant other Available Help at Discharge: Family;Available 24 hours/day Type of Home: Mobile home Home Access: Stairs to enter Entrance Stairs-Rails: None Entrance Stairs-Number of Steps: 2 Home Layout: One level Home Equipment: Walker - 2 wheels;Cane - single point      Prior Function Level of Independence: Independent with assistive device(s)         Comments: per husband, she walked quite stooped with cane     Hand Dominance        Extremity/Trunk Assessment   Upper Extremity Assessment: Overall WFL for tasks assessed           Lower Extremity Assessment: LLE deficits/detail   LLE Deficits / Details: Grossly decr quad strength postop, with noted quad lag, though good quad activation  Cervical / Trunk Assessment: Normal  Communication   Communication: No difficulties  Cognition Arousal/Alertness: Awake/alert Behavior During Therapy: WFL for tasks assessed/performed Overall Cognitive Status: Within Functional Limits for tasks assessed                      General Comments      Exercises Total Joint Exercises Quad Sets: AROM;Both;10 reps Heel Slides: AAROM;Left;5 reps Straight Leg Raises: AAROM;Left;5 reps Goniometric ROM: approx 0-65 degrees      Assessment/Plan    PT Assessment Patient needs continued PT services  PT Diagnosis Difficulty walking;Acute pain   PT Problem List Decreased strength;Decreased range of motion;Decreased activity tolerance;Decreased mobility;Decreased knowledge of use of DME;Pain;Decreased knowledge of precautions  PT Treatment Interventions DME instruction;Gait training;Stair training;Functional mobility training;Therapeutic activities;Therapeutic exercise;Patient/family education   PT Goals (Current goals can be found in the Care  Plan section) Acute Rehab PT Goals Patient Stated Goal: to walk without pain PT Goal Formulation: With patient Time For Goal Achievement: 02/11/14 Potential to Achieve Goals: Good    Frequency 7X/week   Barriers to discharge        Co-evaluation               End of Session Equipment Utilized During Treatment: Gait belt;Left knee immobilizer Activity Tolerance: Patient tolerated treatment well Patient left: in chair;with call bell/phone within reach;with family/visitor present Nurse Communication: Mobility status         Time: 1500-1530 PT Time Calculation (min) (ACUTE ONLY): 30 min   Charges:   PT Evaluation $Initial PT Evaluation Tier I: 1 Procedure PT Treatments $Gait Training: 8-22 mins $Therapeutic Exercise: 8-22 mins   PT G Codes:        Quin Hoop 02/04/2014, 4:12 PM  Roney Marion, PT  Acute  Rehabilitation Services Pager [phone] Office [phone]

## 2014-02-04 NOTE — Transfer of Care (Signed)
Immediate Anesthesia Transfer of Care Note  Patient: Michelle Crane  Procedure(s) Performed: Procedure(s): LEFT TOTAL KNEE ARTHROPLASTY (Left)  Patient Location: PACU  Anesthesia Type:General  Level of Consciousness: awake, alert  and oriented  Airway & Oxygen Therapy: Patient Spontanous Breathing and Patient connected to nasal cannula oxygen  Post-op Assessment: Report given to PACU RN and Post -op Vital signs reviewed and stable  Post vital signs: Reviewed and stable  Complications: No apparent anesthesia complications

## 2014-02-04 NOTE — Op Note (Signed)
DATE OF SURGERY:  02/04/2014 TIME: 9:10 AM  PATIENT NAME:  Michelle Crane   AGE: 59 y.o.    PRE-OPERATIVE DIAGNOSIS:  DJD LEFT KNEE  POST-OPERATIVE DIAGNOSIS:  Same  PROCEDURE:  Procedure(s): LEFT TOTAL KNEE ARTHROPLASTY   SURGEON:  Johnny Bridge, MD   ASSISTANT:  Joya Gaskins, OPA-C, present and scrubbed throughout the case, critical for assistance with exposure, retraction, instrumentation, and closure.   OPERATIVE IMPLANTS: Depuy PFC Sigma, Posterior Stabilized.  Femur size 2.5, Tibia size 2.5, Patella size 35 3-peg oval button, with a 10 mm polyethylene insert.   PREOPERATIVE INDICATIONS:  Michelle Crane is a 59 y.o. year old female with end stage bone on bone degenerative arthritis of the knee who failed conservative treatment, including injections, antiinflammatories, activity modification, and assistive devices, and had significant impairment of their activities of daily living, and elected for Total Knee Arthroplasty.   The risks, benefits, and alternatives were discussed at length including but not limited to the risks of infection, bleeding, nerve injury, stiffness, blood clots, the need for revision surgery, cardiopulmonary complications, among others, and they were willing to proceed.  OPERATIVE FINDINGS AND UNIQUE ASPECTS OF THE CASE:  There was a substantial amount of severe wear on the medial aspect of the tibia. I had to cut the femur twice. The knee had a significant preoperative flexion contracture with severe varus deformity. After the procedure was completed I had full extension, as well as excellent flexion, and the varus deformity was corrected.  OPERATIVE DESCRIPTION:  The patient was brought to the operative room and placed in a supine position.  General anesthesia was administered.  IV antibiotics were given.  The lower extremity was prepped and draped in the usual sterile fashion.  Time out was performed.  The leg was elevated and exsanguinated and the  tourniquet was inflated.  Anterior quadriceps tendon splitting approach was performed.  The patella was everted and osteophytes were removed.  The anterior horn of the medial and lateral meniscus was removed.   The distal femur was opened with the drill and the intramedullary distal femoral cutting jig was utilized, set at 5 degrees resecting 11 mm off the distal femur.  Care was taken to protect the collateral ligaments.  Then the extramedullary tibial cutting jig was utilized making the appropriate cut using the anterior tibial crest as a reference building in appropriate posterior slope.  Care was taken during the cut to protect the medial and collateral ligaments.  The proximal tibia was removed along with the posterior horns of the menisci.  The PCL was sacrificed.    The extensor gap was measured and was approximately 69mm.    The distal femoral sizing jig was applied, taking care to avoid notching.  Then the 4-in-1 cutting jig was applied and the anterior and posterior femur was cut, along with the chamfer cuts.  All posterior osteophytes were removed.  The flexion gap was then measured and was symmetric with the extension gap.  I completed the distal femoral preparation using the appropriate jig to prepare the box.  The patella was then measured, and cut with the saw.  The thickness before the cut was 22 and after the cut was 15.  The proximal tibia sized and prepared accordingly with the reamer and the punch, and then all components were trialed with the 19mm poly insert.  The knee was found to have excellent balance and full motion.    The above named components were then cemented into  place and all excess cement was removed.  The real polyethylene implant was placed.  After the cement had cured I released the tourniquet and confirmed excellent hemostasis with no major posterior vessel injury.    The knee was easily taken through a range of motion and the patella tracked well and the  knee irrigated copiously and the parapatellar and subcutaneous tissue closed with vicryl, and monocryl with steri strips for the skin.  The wounds were injected with marcaine, and dressed with sterile gauze and the patient was awakened and returned to the PACU in stable and satisfactory condition.  There were no complications.  Total tourniquet time was 75  minutes.

## 2014-02-04 NOTE — Anesthesia Postprocedure Evaluation (Signed)
  Anesthesia Post-op Note  Patient: Michelle Crane  Procedure(s) Performed: Procedure(s): LEFT TOTAL KNEE ARTHROPLASTY (Left)  Patient Location: PACU  Anesthesia Type:General and block  Level of Consciousness: awake and alert   Airway and Oxygen Therapy: Patient Spontanous Breathing  Post-op Pain: mild  Post-op Assessment: Post-op Vital signs reviewed, Patient's Cardiovascular Status Stable and Respiratory Function Stable  Post-op Vital Signs: Reviewed  Filed Vitals:   02/04/14 1030  BP: 148/73  Pulse: 73  Temp:   Resp: 11    Complications: No apparent anesthesia complications

## 2014-02-04 NOTE — Progress Notes (Signed)
Report given to emily rn as caergiver

## 2014-02-04 NOTE — Anesthesia Procedure Notes (Addendum)
Procedure Name: LMA Insertion Date/Time: 02/04/2014 7:30 AM Performed by: Garner Nash Pre-anesthesia Checklist: Patient identified, Timeout performed, Emergency Drugs available, Suction available and Patient being monitored Patient Re-evaluated:Patient Re-evaluated prior to inductionOxygen Delivery Method: Circle system utilized Preoxygenation: Pre-oxygenation with 100% oxygen Intubation Type: IV induction LMA: LMA inserted LMA Size: 4.0 Number of attempts: 1 Placement Confirmation: positive ETCO2,  CO2 detector and breath sounds checked- equal and bilateral Tube secured with: Tape Dental Injury: Teeth and Oropharynx as per pre-operative assessment    Anesthesia Regional Block:  Femoral nerve block  Pre-Anesthetic Checklist: ,, timeout performed, Correct Patient, Correct Site, Correct Laterality, Correct Procedure, Correct Position, site marked, Risks and benefits discussed,  Surgical consent,  Pre-op evaluation,  At surgeon's request and post-op pain management  Laterality: Left  Prep: Maximum Sterile Barrier Precautions used, chloraprep and alcohol swabs       Needles:  Injection technique: Single-shot  Needle Type: Stimulator Needle - 80        Needle insertion depth: 5 cm   Additional Needles:  Procedures: nerve stimulator Femoral nerve block  Nerve Stimulator or Paresthesia:  Response: 0.5 mA, 0.1 ms, 5 cm  Additional Responses:   Narrative:  Start time: 02/04/2014 6:55 AM End time: 02/04/2014 7:00 AM Injection made incrementally with aspirations every 5 mL.  Performed by: Personally  Anesthesiologist: Kate Sable  Additional Notes: Pt accepts procedure w/ risks. 20cc 0.5% Marcaine w/ epi w/o discomfort or difficulty. GES

## 2014-02-04 NOTE — Progress Notes (Signed)
Utilization review completed.  

## 2014-02-04 NOTE — Progress Notes (Signed)
Dr fitzgerald notifed of pts bs=247 orders obtained and carried out

## 2014-02-04 NOTE — H&P (Signed)
PREOPERATIVE H&P  Chief Complaint: DJD LEFT KNEE  HPI: Michelle Crane is a 59 y.o. female who presents for preoperative history and physical with a diagnosis of DJD LEFT KNEE. Symptoms are rated as moderate to severe, and have been worsening.  This is significantly impairing activities of daily living.  She has elected for surgical management. She has failed injections, activity modification, anti-inflammatories, and assistive devices.  Preoperative X-rays demonstrate end stage degenerative changes with osteophyte formation, loss of joint space, subchondral sclerosis.  She has already had her contralateral knee replaced. She rates the pain as 10/10. It is significantly impacting her ability to sleep.  Past Medical History  Diagnosis Date  . Complication of anesthesia   . PONV (postoperative nausea and vomiting)   . DDD (degenerative disc disease)   . Depression   . Chronic back pain   . GERD (gastroesophageal reflux disease)   . Carpal tunnel syndrome   . Carpal tunnel syndrome   . Hypertension     takes Lopressor daily  . Hyperlipidemia     doesn't take any meds for this;tries to control with diet  . Shortness of breath     with exertion  . Headache(784.0)     occasionally  . Joint pain   . Joint swelling   . Chronic back pain     spondylolisthesis/stenosis/radiculopathy/hnp  . Umbilical hernia     pt states that it doesn't bother her  . Urinary frequency   . History of bladder infections   . Yeast infection     history of;uses myccostatin cream prn  . Nocturia   . Diabetes mellitus     takes Amaryl daily  . Anxiety     takes Xanax prn  . Insomnia     takes trazodone nightly  . Type 2 diabetes mellitus    Past Surgical History  Procedure Laterality Date  . Cholecystectomy    . Joint replacement  2011    rt total knee  . Carpal tunnel release  05/09/2011    Procedure: CARPAL TUNNEL RELEASE;  Surgeon: Tennis Must, MD;  Location: [facility];   Service: Orthopedics;  Laterality: Right;  . Tonsillectomy  at age 31  . Abdominal hysterectomy  80yrs ago  . Back surgery  [id number]  . Posterior lumbar fusion 4 level  12/27/2011    Procedure: POSTERIOR LUMBAR FUSION 4 LEVEL;  Surgeon: Erline Levine, MD;  Location: [facility];  Service: Neurosurgery;  Laterality: Bilateral;  Lumbar two-three, three-four, four-five, lumbar five sacral one Redo decompression/Fusion   History   Social History  . Marital Status: Married    Spouse Name: N/A    Number of Children: N/A  . Years of Education: N/A   Social History Main Topics  . Smoking status: Never Smoker   . Smokeless tobacco: None  . Alcohol Use: No  . Drug Use: No  . Sexual Activity: Not Currently    Birth Control/ Protection: Surgical   Other Topics Concern  . None   Social History Narrative   History reviewed. No pertinent family history. Allergies  Allergen Reactions  . Flagyl [Metronidazole] Other (See Comments)    REACTION: makes disoriented   Prior to Admission medications   Medication Sig Start Date End Date Taking? Authorizing Provider  ALPRAZolam Duanne Moron) 1 MG tablet Take 1 mg by mouth 3 (three) times daily as needed. For anxiety.   Yes Historical Provider, MD  citalopram (CELEXA) 20 MG tablet Take 20 mg by  mouth daily.   Yes Historical Provider, MD  HYDROcodone-acetaminophen (NORCO) 10-325 MG per tablet Take 1 tablet by mouth every 6 (six) hours as needed.   Yes Historical Provider, MD  metFORMIN (GLUCOPHAGE-XR) 500 MG 24 hr tablet Take 500 mg by mouth daily with breakfast.   Yes Historical Provider, MD  metoprolol (LOPRESSOR) 100 MG tablet Take 100 mg by mouth 2 (two) times daily.   Yes Historical Provider, MD  nystatin cream (MYCOSTATIN) Apply 1 application topically at bedtime. For chronic yeast infection.   Yes Historical Provider, MD  omeprazole (PRILOSEC) 40 MG capsule Take 40 mg by mouth daily.   Yes Historical Provider, MD  Polyethyl Glycol-Propyl Glycol  (SYSTANE) 0.4-0.3 % SOLN Apply 1 drop to eye 2 (two) times daily.   Yes Historical Provider, MD  traZODone (DESYREL) 50 MG tablet Take 50 mg by mouth at bedtime.   Yes Historical Provider, MD  methocarbamol (ROBAXIN) 500 MG tablet Take 1 tablet (500 mg total) by mouth every 6 (six) hours as needed. 12/29/11   Erline Levine, MD     Positive ROS: All other systems have been reviewed and were otherwise negative with the exception of those mentioned in the HPI and as above.  Physical Exam: General: Alert, no acute distress Cardiovascular: No pedal edema Respiratory: No cyanosis, no use of accessory musculature GI: No organomegaly, abdomen is soft and non-tender Skin: No lesions in the area of chief complaint Neurologic: Sensation intact distally Psychiatric: Patient is competent for consent with normal mood and affect Lymphatic: No axillary or cervical lymphadenopathy  MUSCULOSKELETAL: Left knee has range of motion from 10 to 90 with positive laxity, or actual pseudo-laxity to valgus testing with varus overall alignment and crepitance diffusely.  Assessment: DJD LEFT KNEE  Plan: Plan for Procedure(s): LEFT TOTAL KNEE ARTHROPLASTY  The risks benefits and alternatives were discussed with the patient including but not limited to the risks of nonoperative treatment, versus surgical intervention including infection, bleeding, nerve injury,  blood clots, cardiopulmonary complications, morbidity, mortality, among others, and they were willing to proceed.   Johnny Bridge, MD Cell [phone]   02/04/2014 6:09 AM

## 2014-02-05 ENCOUNTER — Encounter (HOSPITAL_COMMUNITY): Payer: Self-pay | Admitting: Orthopedic Surgery

## 2014-02-05 LAB — CBC
HCT: 35.2 % — ABNORMAL LOW (ref 36.0–46.0)
Hemoglobin: 11.4 g/dL — ABNORMAL LOW (ref 12.0–15.0)
MCH: 30 pg (ref 26.0–34.0)
MCHC: 32.4 g/dL (ref 30.0–36.0)
MCV: 92.6 fL (ref 78.0–100.0)
Platelets: 152 10*3/uL (ref 150–400)
RBC: 3.8 MIL/uL — AB (ref 3.87–5.11)
RDW: 13.3 % (ref 11.5–15.5)
WBC: 8.6 10*3/uL (ref 4.0–10.5)

## 2014-02-05 LAB — BASIC METABOLIC PANEL
Anion gap: 9 (ref 5–15)
BUN: 9 mg/dL (ref 6–23)
CHLORIDE: 92 meq/L — AB (ref 96–112)
CO2: 29 mmol/L (ref 19–32)
CREATININE: 1.06 mg/dL (ref 0.50–1.10)
Calcium: 8.1 mg/dL — ABNORMAL LOW (ref 8.4–10.5)
GFR calc non Af Amer: 55 mL/min — ABNORMAL LOW (ref >90)
GFR, EST AFRICAN AMERICAN: 64 mL/min — AB (ref >90)
GLUCOSE: 231 mg/dL — AB (ref 70–99)
Potassium: 4.5 mmol/L (ref 3.5–5.1)
Sodium: 130 mmol/L — ABNORMAL LOW (ref 135–145)

## 2014-02-05 LAB — GLUCOSE, CAPILLARY
GLUCOSE-CAPILLARY: 228 mg/dL — AB (ref 70–99)
GLUCOSE-CAPILLARY: 299 mg/dL — AB (ref 70–99)
GLUCOSE-CAPILLARY: 240 mg/dL — AB (ref 70–99)
GLUCOSE-CAPILLARY: 254 mg/dL — AB (ref 70–99)

## 2014-02-05 MED ORDER — KETOROLAC TROMETHAMINE 15 MG/ML IJ SOLN
INTRAMUSCULAR | Status: AC
  Filled 2014-02-05: qty 1

## 2014-02-05 MED ORDER — INSULIN ASPART 100 UNIT/ML ~~LOC~~ SOLN
0.0000 [IU] | Freq: Three times a day (TID) | SUBCUTANEOUS | Status: DC
  Administered 2014-02-05: 8 [IU] via SUBCUTANEOUS
  Administered 2014-02-05: 5 [IU] via SUBCUTANEOUS
  Administered 2014-02-06: 8 [IU] via SUBCUTANEOUS
  Administered 2014-02-06: 11 [IU] via SUBCUTANEOUS
  Administered 2014-02-06: 8 [IU] via SUBCUTANEOUS

## 2014-02-05 NOTE — Progress Notes (Signed)
Physical Therapy Treatment Patient Details Name: Michelle Crane MRN: [id number] DOB: 23-Jun-1955 Today's Date: 02/05/2014    History of Present Illness Admitted for L TKA    PT Comments    Pt able to progress mobility with max encouragement and min (A). Pt fatigues quickly. Pt hopeful to D/C home tomorrow. Patient needs to practice stairs next session.     Follow Up Recommendations  Home health PT;Supervision/Assistance - 24 hour     Equipment Recommendations  3in1 (PT)    Recommendations for Other Services       Precautions / Restrictions Precautions Precautions: Knee Precaution Comments: reviewed keeping knee in ext at rest Required Braces or Orthoses: Knee Immobilizer - Left Knee Immobilizer - Left: On when out of bed or walking Restrictions Weight Bearing Restrictions: Yes LLE Weight Bearing: Weight bearing as tolerated    Mobility  Bed Mobility Overal bed mobility: Needs Assistance Bed Mobility: Supine to Sit     Supine to sit: Mod assist;HOB elevated     General bed mobility comments: mod (A) to bring Lt LE to/off EOB and to elevate trunk  Transfers Overall transfer level: Needs assistance Equipment used: Rolling walker (2 wheeled) Transfers: Sit to/from Stand Sit to Stand: Min assist         General transfer comment: (A) to power up and maintain balance when transferring hands to RW  Ambulation/Gait Ambulation/Gait assistance: Min assist Ambulation Distance (Feet): 30 Feet Assistive device: Rolling walker (2 wheeled) Gait Pattern/deviations: Step-to pattern;Decreased stance time - left;Decreased step length - right;Antalgic;Trunk flexed Gait velocity: decreased Gait velocity interpretation: Below normal speed for age/gender General Gait Details: pt fatigued quickly; cues for step through gt and upright posture; min (A) to manage RW   Stairs            Wheelchair Mobility    Modified Rankin (Stroke Patients Only)       Balance  Overall balance assessment: Needs assistance Sitting-balance support: Feet supported;No upper extremity supported Sitting balance-Leahy Scale: Fair Sitting balance - Comments: denied dizziness   Standing balance support: During functional activity;Bilateral upper extremity supported Standing balance-Leahy Scale: Poor Standing balance comment: RW to balance                     Cognition Arousal/Alertness: Awake/alert Behavior During Therapy: Flat affect Overall Cognitive Status: Impaired/Different from baseline Area of Impairment: Attention;Memory;Safety/judgement;Problem solving   Current Attention Level: Sustained Memory: Decreased short-term memory;Decreased recall of precautions   Safety/Judgement: Decreased awareness of deficits;Decreased awareness of safety   Problem Solving: Slow processing;Requires verbal cues;Requires tactile cues;Decreased initiation General Comments: pt unable to recall previous session from earlier today     Exercises Total Joint Exercises Ankle Circles/Pumps: AROM;Both;10 reps;Seated Quad Sets: AROM;Both;10 reps Hip ABduction/ADduction: AAROM;Left;10 reps;Seated Long Arc Quad: AAROM;Left;10 reps;Seated Long Arc Quad Limitations: pain     General Comments        Pertinent Vitals/Pain Pain Assessment: 0-10 Pain Score: 8  Pain Location: "bottom"  Pain Descriptors / Indicators: Aching;Sore Pain Intervention(s): Monitored during session;Premedicated before session;Repositioned;Ice applied    Home Living Family/patient expects to be discharged to:: Private residence Living Arrangements: Spouse/significant other Available Help at Discharge: Family;Available 24 hours/day Type of Home: Mobile home Home Access: Stairs to enter Entrance Stairs-Rails: None Home Layout: One level Home Equipment: Environmental consultant - 2 wheels;Cane - single point;Bedside commode;Shower seat - built in Additional Comments: unsure of accuracy as pt very lethargic    Prior  Function Level of Independence: Independent with assistive  device(s)          PT Goals (current goals can now be found in the care plan section) Acute Rehab PT Goals Patient Stated Goal: to go home tomorrow PT Goal Formulation: With patient Time For Goal Achievement: 02/11/14 Potential to Achieve Goals: Good Progress towards PT goals: Progressing toward goals    Frequency  7X/week    PT Plan Current plan remains appropriate    Co-evaluation             End of Session Equipment Utilized During Treatment: Gait belt;Left knee immobilizer Activity Tolerance: Patient tolerated treatment well Patient left: in chair;with call bell/phone within reach;with chair alarm set     Time: 2458-0998 PT Time Calculation (min) (ACUTE ONLY): 26 min  Charges:  $Gait Training: 8-22 mins $Therapeutic Exercise: 8-22 mins                    G CodesGustavus Bryant, [place]  [phone] 02/05/2014, 4:39 PM

## 2014-02-05 NOTE — Progress Notes (Signed)
Patient ID: LORELY BUBB, female   DOB: 16-Jun-1955, 59 y.o.   MRN: [id number]     Subjective:  Patient reports pain as mild to moderate.  Patient states that she would like to go home as soon as possible  Denies any CP or SOB Objective:   VITALS:   Filed Vitals:   02/05/14 0000 02/05/14 0125 02/05/14 0400 02/05/14 0500  BP:  87/51  101/45  Pulse:  80  81  Temp:  98.4 F (36.9 C)  99.5 F (37.5 C)  TempSrc:  Oral  Oral  Resp: 16 16 18 18   Weight:      SpO2: 92% 95% 94% 96%    ABD soft Sensation intact distally Dorsiflexion/Plantar flexion intact Incision: dressing C/D/I and no drainage Good foot and ankle motion  Lab Results  Component Value Date   WBC 8.6 02/05/2014   HGB 11.4* 02/05/2014   HCT 35.2* 02/05/2014   MCV 92.6 02/05/2014   PLT 152 02/05/2014   BMET    Component Value Date/Time   NA 130* 02/05/2014 0555   K 4.5 02/05/2014 0555   CL 92* 02/05/2014 0555   CO2 29 02/05/2014 0555   GLUCOSE 231* 02/05/2014 0555   BUN 9 02/05/2014 0555   CREATININE 1.06 02/05/2014 0555   CALCIUM 8.1* 02/05/2014 0555   GFRNONAA 55* 02/05/2014 0555   GFRAA 64* 02/05/2014 0555     Assessment/Plan: 1 Day Post-Op   Principal Problem:   Primary localized osteoarthritis of left knee Active Problems:   Knee osteoarthritis   Advance diet Up with therapy  WBAT Dry dressing PRN Plan for DC later today or tomorrow if patient does well with PT   Rande Brunt, BRANDON 02/05/2014, 7:40 AM  Seen and agree.  Marchia Bond, MD Cell [phone]

## 2014-02-05 NOTE — Progress Notes (Signed)
Physical Therapy Treatment Patient Details Name: GIFT RUECKERT MRN: [id number] DOB: 07/21/1955 Today's Date: 02/05/2014    History of Present Illness Admitted for L TKA    PT Comments    Pt requiring min (A) for all mobility at this time and unable to ambulate more than 57ft due to pain and fatigue. Pt unsafe to D/C home from mobility standpoint at this time. Will cont to follow per POC.   Follow Up Recommendations  Home health PT;Supervision/Assistance - 24 hour     Equipment Recommendations  3in1 (PT)    Recommendations for Other Services       Precautions / Restrictions Precautions Precautions: Knee Precaution Comments: reviewed no pillow under knee Required Braces or Orthoses: Knee Immobilizer - Left Knee Immobilizer - Left: On when out of bed or walking Restrictions Weight Bearing Restrictions: Yes LLE Weight Bearing: Weight bearing as tolerated    Mobility  Bed Mobility Overal bed mobility: Needs Assistance Bed Mobility: Supine to Sit     Supine to sit: Min assist;HOB elevated     General bed mobility comments: (A) To bring lt Le to/off EOB  Transfers Overall transfer level: Needs assistance Equipment used: Rolling walker (2 wheeled) Transfers: Sit to/from Stand Sit to Stand: Min assist         General transfer comment: min (A) to power up; pt with difficulty WB'ing through Lt LE ; cues for technique and hand placement   Ambulation/Gait Ambulation/Gait assistance: Min assist Ambulation Distance (Feet): 10 Feet Assistive device: Rolling walker (2 wheeled) Gait Pattern/deviations: Step-to pattern;Decreased stance time - left;Decreased step length - right;Antalgic;Narrow base of support;Trunk flexed Gait velocity: decreased Gait velocity interpretation: Below normal speed for age/gender General Gait Details: cues for step through gt; pt demo difficulty WB'ing through [place]; limited by pain and fatigue    Stairs            Wheelchair  Mobility    Modified Rankin (Stroke Patients Only)       Balance Overall balance assessment: Needs assistance Sitting-balance support: Feet supported;No upper extremity supported Sitting balance-Leahy Scale: Fair Sitting balance - Comments: sat EOB ~7 min; initially c/o dizziness; BP 121/59   Standing balance support: During functional activity;Bilateral upper extremity supported Standing balance-Leahy Scale: Poor Standing balance comment: RW to balance                     Cognition Arousal/Alertness: Awake/alert Behavior During Therapy: WFL for tasks assessed/performed;Anxious Overall Cognitive Status: Within Functional Limits for tasks assessed                      Exercises Total Joint Exercises Ankle Circles/Pumps: AROM;Both;10 reps;Seated Quad Sets: AROM;Both;10 reps Long Arc Quad: AAROM;Left;10 reps;Other (comment);Limitations (2 sets x 10 ) Long Arc Quad Limitations: pain  Knee Flexion: AAROM;Left;10 reps;Seated;Other (comment);Limitations (2 sets x 10) Knee Flexion Limitations: pain Goniometric ROM: 5 to 60 degrees limited by pain     General Comments General comments (skin integrity, edema, etc.): encouraged OOB to BSC vs bed pan with nursing       Pertinent Vitals/Pain Pain Assessment: 0-10 Pain Score: 7  Pain Location: denies pain in knee/ c/o pain in "bottom"  Pain Descriptors / Indicators: Discomfort Pain Intervention(s): Monitored during session;Premedicated before session;Repositioned;Ice applied    Home Living                      Prior Function  PT Goals (current goals can now be found in the care plan section) Acute Rehab PT Goals Patient Stated Goal: to go home  PT Goal Formulation: With patient Time For Goal Achievement: 02/11/14 Potential to Achieve Goals: Good Progress towards PT goals: Progressing toward goals    Frequency  7X/week    PT Plan Current plan remains appropriate    Co-evaluation              End of Session Equipment Utilized During Treatment: Gait belt;Left knee immobilizer Activity Tolerance: Patient tolerated treatment well Patient left: in chair;with call bell/phone within reach     Time: 0835-0908 PT Time Calculation (min) (ACUTE ONLY): 33 min  Charges:  $Gait Training: 8-22 mins $Therapeutic Exercise: 8-22 mins                    G CodesGustavus Bryant, [place]  [phone] 02/05/2014, 9:40 AM

## 2014-02-05 NOTE — Discharge Summary (Signed)
Physician Discharge Summary  Patient ID: Michelle Crane MRN: [id number] DOB/AGE: January 03, 1956 59 y.o.  Admit date: 02/04/2014 Discharge date: 02/05/2014  Admission Diagnoses:  Primary localized osteoarthritis of left knee  Discharge Diagnoses:  Principal Problem:   Primary localized osteoarthritis of left knee Active Problems:   Knee osteoarthritis   Past Medical History  Diagnosis Date  . Complication of anesthesia   . PONV (postoperative nausea and vomiting)   . DDD (degenerative disc disease)   . Depression   . Chronic back pain   . GERD (gastroesophageal reflux disease)   . Carpal tunnel syndrome   . Carpal tunnel syndrome   . Hypertension     takes Lopressor daily  . Hyperlipidemia     doesn't take any meds for this;tries to control with diet  . Shortness of breath     with exertion  . Headache(784.0)     occasionally  . Joint pain   . Joint swelling   . Chronic back pain     spondylolisthesis/stenosis/radiculopathy/hnp  . Umbilical hernia     pt states that it doesn't bother her  . Urinary frequency   . History of bladder infections   . Yeast infection     history of;uses myccostatin cream prn  . Nocturia   . Diabetes mellitus     takes Amaryl daily  . Anxiety     takes Xanax prn  . Insomnia     takes trazodone nightly  . Type 2 diabetes mellitus   . Primary localized osteoarthritis of left knee 02/04/2014    Surgeries: Procedure(s): LEFT TOTAL KNEE ARTHROPLASTY on 02/04/2014   Consultants (if any):    Discharged Condition: Improved  Hospital Course: Michelle Crane is an 59 y.o. female who was admitted 02/04/2014 with a diagnosis of Primary localized osteoarthritis of left knee and went to the operating room on 02/04/2014 and underwent the above named procedures.    She was given perioperative antibiotics:  Anti-infectives    Start     Dose/Rate Route Frequency Ordered Stop   02/04/14 1500  ceFAZolin (ANCEF) IVPB 2 g/50 mL premix     2 g100 mL/hr  over 30 Minutes Intravenous Every 6 hours 02/04/14 1436 02/04/14 2050   02/04/14 0600  ceFAZolin (ANCEF) IVPB 2 g/50 mL premix     2 g100 mL/hr over 30 Minutes Intravenous On call to O.R. 02/03/14 1323 02/04/14 0728   02/04/14 0550  ceFAZolin (ANCEF) 2-3 GM-% IVPB SOLR    Comments:  [name]   : cabinet override      02/04/14 0550 02/04/14 1759    .  She was given sequential compression devices, early ambulation, and xarelto for DVT prophylaxis.  She benefited maximally from the hospital stay and there were no complications.    Recent vital signs:  Filed Vitals:   02/05/14 0500  BP: 101/45  Pulse: 81  Temp: 99.5 F (37.5 C)  Resp: 18    Recent laboratory studies:  Lab Results  Component Value Date   HGB 11.4* 02/05/2014   HGB 13.6 01/22/2014   HGB 14.9 12/19/2011   Lab Results  Component Value Date   WBC 8.6 02/05/2014   PLT 152 02/05/2014   Lab Results  Component Value Date   INR 1.02 01/22/2014   Lab Results  Component Value Date   NA 130* 02/05/2014   K 4.5 02/05/2014   CL 92* 02/05/2014   CO2 29 02/05/2014   BUN 9 02/05/2014   CREATININE 1.06  02/05/2014   GLUCOSE 231* 02/05/2014    Discharge Medications:     Medication List    TAKE these medications        ALPRAZolam 1 MG tablet  Commonly known as:  XANAX  Take 1 mg by mouth 3 (three) times daily as needed. For anxiety.     citalopram 20 MG tablet  Commonly known as:  CELEXA  Take 20 mg by mouth daily.     HYDROcodone-acetaminophen 10-325 MG per tablet  Commonly known as:  NORCO  Take 1 tablet by mouth every 6 (six) hours as needed.     metFORMIN 500 MG 24 hr tablet  Commonly known as:  GLUCOPHAGE-XR  Take 500 mg by mouth daily with breakfast.     methocarbamol 500 MG tablet  Commonly known as:  ROBAXIN  Take 1 tablet (500 mg total) by mouth every 6 (six) hours as needed.     metoprolol 100 MG tablet  Commonly known as:  LOPRESSOR  Take 100 mg by mouth 2 (two) times daily.      nystatin cream  Commonly known as:  MYCOSTATIN  Apply 1 application topically at bedtime. For chronic yeast infection.     omeprazole 40 MG capsule  Commonly known as:  PRILOSEC  Take 40 mg by mouth daily.     ondansetron 4 MG tablet  Commonly known as:  ZOFRAN  Take 1 tablet (4 mg total) by mouth every 8 (eight) hours as needed for nausea or vomiting.     oxyCODONE-acetaminophen 10-325 MG per tablet  Commonly known as:  PERCOCET  Take 1-2 tablets by mouth every 6 (six) hours as needed for pain. MAXIMUM TOTAL ACETAMINOPHEN DOSE IS 4000 MG PER DAY     rivaroxaban 10 MG Tabs tablet  Commonly known as:  XARELTO  Take 1 tablet (10 mg total) by mouth daily.     sennosides-docusate sodium 8.6-50 MG tablet  Commonly known as:  SENOKOT-S  Take 2 tablets by mouth daily.     SYSTANE 0.4-0.3 % Soln  Generic drug:  Polyethyl Glycol-Propyl Glycol  Apply 1 drop to eye 2 (two) times daily.     traZODone 50 MG tablet  Commonly known as:  DESYREL  Take 50 mg by mouth at bedtime.        Diagnostic Studies: Dg Knee Left Port  02/04/2014   CLINICAL DATA:  59 year old status post knee replacement  EXAM: PORTABLE LEFT KNEE - 1-2 VIEW  COMPARISON:  None.  FINDINGS: The patient is status post left knee arthroplasty. The femoral and tibial components are well seated and approximated. There is no dislocation. Soft tissue edema and subcutaneous emphysema are compatible with recent surgery.  IMPRESSION: Radiographically intact left knee replacement.   Electronically Signed   By: Rosemarie Ax   On: 02/04/2014 11:43    Disposition: 01-Home or Self Care        Follow-up Information    Follow up with Johnny Bridge, MD. Schedule an appointment as soon as possible for a visit in 2 weeks.   Specialty:  Orthopedic Surgery   Contact information:   [address] [place] [address] [phone]        Signed: Johnny Bridge 02/05/2014, 7:58 AM

## 2014-02-05 NOTE — Progress Notes (Signed)
Inpatient Diabetes Program Recommendations  AACE/ADA: New Consensus Statement on Inpatient Glycemic Control (2013)  Target Ranges:  Prepandial:   less than 140 mg/dL      Peak postprandial:   less than 180 mg/dL (1-2 hours)      Critically ill patients:  140 - 180 mg/dL   Results for JAICE, LAGUE (MRN [id number]) as of 02/05/2014 13:48  Ref. Range 02/04/2014 05:51 02/04/2014 08:36 02/04/2014 09:10 02/04/2014 09:42 02/04/2014 23:23 02/05/2014 07:09 02/05/2014 12:18  Glucose-Capillary Latest Range: 70-99 mg/dL 219 (H) 220 (H) 220 (H) 247 (H) 253 (H) 228 (H) 299 (H)   Diabetes history: DM2 Outpatient Diabetes medications: Metformin XR 500 mg QAM Current orders for Inpatient glycemic control: Novolog 0-15 units TID with meals  Inpatient Diabetes Program Recommendations Insulin - Basal: Fasting glucose on 02/04/14 was 219 mg/dl on 1/12 and 228 mg/dl this morning. While inpatient, please consider ordering low dose basal insulin. Recommend starting with Levemir 8 units QHS (based on 75.3 kg x 0.1 units). Correction (SSI): Bedtime glucose noted to be 253 mg/dl at 23:23 last night. Please consider ordering Novolog bedtime correction scale. HgbA1C: Please consider ordering an A1C to evaluate glycemic control over the past 2-3 months.  Thanks, Barnie Alderman, RN, MSN, CCRN, CDE Diabetes Coordinator Inpatient Diabetes Program [phone] (Team Pager) [phone] (AP office) [phone] [facility] office)

## 2014-02-05 NOTE — Progress Notes (Signed)
Occupational Therapy Evaluation Patient Details Name: Michelle Crane MRN: [id number] DOB: [birth year] Today's Date: 02/05/2014    History of Present Illness Admitted for L TKA   Clinical Impression   Evaluation limited by lethargy. Pt c/o dizziness in supine position. BP129/62. O2 80 RA. O2 increased to 2L. O2 Sats to 93. Nsg notified of O2 Sats and lethargy. Will plan to see in am to address functional mobility and compensatory techniques for ADL to facilitate D/C home.     Follow Up Recommendations  Supervision - Intermittent;No OT follow up    Equipment Recommendations  None recommended by OT    Recommendations for Other Services       Precautions / Restrictions Precautions Precautions: Knee Precaution Comments: reviewed no pillow under knee Required Braces or Orthoses: Knee Immobilizer - Left Knee Immobilizer - Left: On when out of bed or walking Restrictions Weight Bearing Restrictions: Yes LLE Weight Bearing: Weight bearing as tolerated      Mobility Bed Mobility      General bed mobility comments: Attempted bed mobility. Pt continued to fall asleep  Transfers          General transfer comment: unable to assess due to lethargy    Balance                            ADL Overall ADL's : Needs assistance/impaired                                       General ADL Comments: will further assess. Attempted to educate on shower transfer and LB ADL, however pt unable to stay awake.      Vision                     Perception     Praxis      Pertinent Vitals/Pain Pain Assessment: 0-10 Pain Score: 7  Pain Location: back Pain Descriptors / Indicators: Aching Pain Intervention(s): Monitored during session     Hand Dominance Right   Extremity/Trunk Assessment Upper Extremity Assessment Upper Extremity Assessment: Overall WFL for tasks assessed   Lower Extremity Assessment Lower Extremity Assessment: LLE  deficits/detail;Defer to PT evaluation       Communication Communication Communication: No difficulties   Cognition Arousal/Alertness: Lethargic Behavior During Therapy: Flat affect Overall Cognitive Status: Impaired/Different from baseline (suspect due to medication)                     General Comments       Exercises Exercises: Total Joint     Shoulder Instructions      Home Living Family/patient expects to be discharged to:: Private residence Living Arrangements: Spouse/significant other Available Help at Discharge: Family;Available 24 hours/day Type of Home: Mobile home Home Access: Stairs to enter Entrance Stairs-Number of Steps: 2 Entrance Stairs-Rails: None Home Layout: One level     Bathroom Shower/Tub: Occupational psychologist: Standard Bathroom Accessibility: Yes How Accessible: Accessible via walker Home Equipment: [place] - 2 wheels;Cane - single point;Bedside commode;Shower seat - built in   Additional Comments: unsure of accuracy as pt very lethargic      Prior Functioning/Environment Level of Independence: Independent with assistive device(s)             OT Diagnosis: Generalized weakness;Acute pain   OT Problem List: Decreased strength;Decreased range  of motion;Decreased activity tolerance;Decreased cognition;Decreased safety awareness;Pain;Obesity   OT Treatment/Interventions: Self-care/ADL training;DME and/or AE instruction;Therapeutic activities;Patient/family education    OT Goals(Current goals can be found in the care plan section) Acute Rehab OT Goals Patient Stated Goal: to go home  OT Goal Formulation: With patient Time For Goal Achievement: 02/12/14 Potential to Achieve Goals: Good  OT Frequency: Min 2X/week   Barriers to D/C:            Co-evaluation              End of Session Equipment Utilized During Treatment: Oxygen Nurse Communication: Mobility status;Other (comment) (O2 Sata)  Activity  Tolerance: Patient limited by lethargy;Patient limited by fatigue Patient left: in bed;with call bell/phone within reach   Time: 1255-1322 OT Time Calculation (min): 27 min Charges:  OT General Charges $OT Visit: 1 Procedure OT Evaluation $Initial OT Evaluation Tier I: 1 Procedure OT Treatments $Therapeutic Activity: 8-22 mins G-Codes:    Danaye Sobh,HILLARY 03/04/14, 1:30 PM   [facility], OTR/L  [phone] 03-04-2014

## 2014-02-05 NOTE — Plan of Care (Signed)
Problem: Consults Goal: Diagnosis- Total Joint Replacement Primary Total Knee Left     

## 2014-02-05 NOTE — Progress Notes (Signed)
Orthopedic Tech Progress Note Patient Details:  Michelle Crane 1956/01/24 [id number]  Ortho Devices Ortho Device/Splint Location: trapeze bar patient helper Ortho Device/Splint Interventions: Application Viewed order from doctor's order list  Hildred Priest 02/05/2014, 11:10 AM

## 2014-02-05 NOTE — Discharge Instructions (Signed)
Diet: As you were doing prior to hospitalization   Shower:  May shower but keep the wounds dry, use an occlusive plastic wrap, NO SOAKING IN TUB.  If the bandage gets wet, change with a clean dry gauze.  Dressing:  You may change your dressing 3-5 days after surgery.  Then change the dressing daily with sterile gauze dressing.    There are sticky tapes (steri-strips) on your wounds and all the stitches are absorbable.  Leave the steri-strips in place when changing your dressings, they will peel off with time, usually 2-3 weeks.  Activity:  Increase activity slowly as tolerated, but follow the weight bearing instructions below.  No lifting or driving for 6 weeks.  Weight Bearing:   As tolerated.    To prevent constipation: you may use a stool softener such as -  Colace (over the counter) 100 mg by mouth twice a day  Drink plenty of fluids (prune juice may be helpful) and high fiber foods Miralax (over the counter) for constipation as needed.    Itching:  If you experience itching with your medications, try taking only a single pain pill, or even half a pain pill at a time.  You may take up to 10 pain pills per day, and you can also use benadryl over the counter for itching or also to help with sleep.   Precautions:  If you experience chest pain or shortness of breath - call 911 immediately for transfer to the hospital emergency department!!  If you develop a fever greater that 101 F, purulent drainage from wound, increased redness or drainage from wound, or calf pain -- Call the office at [phone]                                                Follow- Up Appointment:  Please call for an appointment to be seen in 2 weeks [place] - [phone]     Information on my medicine - XARELTO (Rivaroxaban)  This medication education was reviewed with me or my healthcare representative as part of my discharge preparation.  The pharmacist that spoke with me during my hospital stay was:   Eudelia Bunch, [facility]  Why was Xarelto prescribed for you? Xarelto was prescribed for you to reduce the risk of blood clots forming after orthopedic surgery. The medical term for these abnormal blood clots is venous thromboembolism (VTE).  What do you need to know about xarelto ? Take your Xarelto ONCE DAILY at the same time every day. You may take it either with or without food.  If you have difficulty swallowing the tablet whole, you may crush it and mix in applesauce just prior to taking your dose.  Take Xarelto exactly as prescribed by your doctor and DO NOT stop taking Xarelto without talking to the doctor who prescribed the medication.  Stopping without other VTE prevention medication to take the place of Xarelto may increase your risk of developing a clot.  After discharge, you should have regular check-up appointments with your healthcare provider that is prescribing your Xarelto.    What do you do if you miss a dose? If you miss a dose, take it as soon as you remember on the same day then continue your regularly scheduled once daily regimen the next day. Do not take two doses of Xarelto on the same day.  Important Safety Information A possible side effect of Xarelto is bleeding. You should call your healthcare provider right away if you experience any of the following: ? Bleeding from an injury or your nose that does not stop. ? Unusual colored urine (red or dark brown) or unusual colored stools (red or black). ? Unusual bruising for unknown reasons. ? A serious fall or if you hit your head (even if there is no bleeding).  Some medicines may interact with Xarelto and might increase your risk of bleeding while on Xarelto. To help avoid this, consult your healthcare provider or pharmacist prior to using any new prescription or non-prescription medications, including herbals, vitamins, non-steroidal anti-inflammatory drugs (NSAIDs) and supplements.  This website has more  information on Xarelto: [email or web address].[email or web address]

## 2014-02-06 ENCOUNTER — Inpatient Hospital Stay (HOSPITAL_COMMUNITY): Payer: Medicare Other

## 2014-02-06 LAB — GLUCOSE, CAPILLARY
GLUCOSE-CAPILLARY: 254 mg/dL — AB (ref 70–99)
Glucose-Capillary: 293 mg/dL — ABNORMAL HIGH (ref 70–99)
Glucose-Capillary: 309 mg/dL — ABNORMAL HIGH (ref 70–99)
Glucose-Capillary: 189 mg/dL — ABNORMAL HIGH (ref 70–99)

## 2014-02-06 LAB — CBC
HCT: 36.9 % (ref 36.0–46.0)
HEMOGLOBIN: 12 g/dL (ref 12.0–15.0)
MCH: 30.7 pg (ref 26.0–34.0)
MCHC: 32.5 g/dL (ref 30.0–36.0)
MCV: 94.4 fL (ref 78.0–100.0)
Platelets: 144 10*3/uL — ABNORMAL LOW (ref 150–400)
RBC: 3.91 MIL/uL (ref 3.87–5.11)
RDW: 13.3 % (ref 11.5–15.5)
WBC: 7.7 10*3/uL (ref 4.0–10.5)

## 2014-02-06 MED ORDER — INSULIN ASPART 100 UNIT/ML ~~LOC~~ SOLN: 0.0000 [IU] | Freq: Every day | SUBCUTANEOUS | Status: DC

## 2014-02-06 MED ORDER — INSULIN ASPART 100 UNIT/ML ~~LOC~~ SOLN
0.0000 [IU] | Freq: Three times a day (TID) | SUBCUTANEOUS | Status: DC
  Administered 2014-02-07: 5 [IU] via SUBCUTANEOUS

## 2014-02-06 NOTE — Progress Notes (Signed)
Pt's temperature has decreased to 98.9.

## 2014-02-06 NOTE — Progress Notes (Signed)
Physical Therapy Treatment Patient Details Name: Michelle Crane MRN: [id number] DOB: 18-Oct-1955 Today's Date: 02/06/2014    History of Present Illness Admitted for L TKA    PT Comments    Pt continues to fatigue very quickly. Practiced stair management this session and will practice/educate husband/family this afternoon prior to D/C. Pt does have tendency to lean posteriorly, plan to educate family on guarding technique.   Follow Up Recommendations  Home health PT;Supervision/Assistance - 24 hour     Equipment Recommendations  3in1 (PT)    Recommendations for Other Services       Precautions / Restrictions Precautions Precautions: Knee Precaution Comments: unable to recall no pillow under knee precaution Required Braces or Orthoses: Knee Immobilizer - Left Knee Immobilizer - Left: On when out of bed or walking Restrictions Weight Bearing Restrictions: Yes LLE Weight Bearing: Weight bearing as tolerated    Mobility  Bed Mobility Overal bed mobility: Needs Assistance Bed Mobility: Supine to Sit     Supine to sit: Min guard     General bed mobility comments: bed flattened to simulate home; min guard to control LE to floor; pt with incr difficulty but able to come up to sitting with incr time and cueing   Transfers Overall transfer level: Needs assistance Equipment used: Rolling walker (2 wheeled) Transfers: Sit to/from Stand Sit to Stand: Min guard         General transfer comment: min guard to steady; cues for hand placement ; pt with LOB posteriorly with intital standing; multimodal cues for self correction   Ambulation/Gait Ambulation/Gait assistance: Min guard Ambulation Distance (Feet): 60 Feet (50', 30' ) Assistive device: Rolling walker (2 wheeled) Gait Pattern/deviations: Step-to pattern;Decreased step length - right;Decreased stance time - left;Wide base of support;Leaning posteriorly;Antalgic Gait velocity: decreased Gait velocity interpretation:  Below normal speed for age/gender General Gait Details: cues for step through gt and safety with RW; pt fatigued quickly   Stairs Stairs: Yes Stairs assistance: Min guard Stair Management: No rails;Step to pattern;Backwards;With walker Number of Stairs: 2 General stair comments: cues for sequencing; min guard to steady RW   Wheelchair Mobility    Modified Rankin (Stroke Patients Only)       Balance Overall balance assessment: Needs assistance Sitting-balance support: Feet supported;No upper extremity supported Sitting balance-Leahy Scale: Good Sitting balance - Comments: denied dizziness   Standing balance support: During functional activity;Bilateral upper extremity supported Standing balance-Leahy Scale: Poor Standing balance comment: RW to balance                     Cognition Arousal/Alertness: Awake/alert Behavior During Therapy: Flat affect Overall Cognitive Status: Impaired/Different from baseline Area of Impairment: Attention;Memory;Safety/judgement;Problem solving   Current Attention Level: Selective Memory: Decreased short-term memory;Decreased recall of precautions   Safety/Judgement: Decreased awareness of deficits;Decreased awareness of safety   Problem Solving: Slow processing;Requires verbal cues;Requires tactile cues;Decreased initiation General Comments: requires simple one step commands and max reinforecement of previous education     Exercises Total Joint Exercises Ankle Circles/Pumps: AROM;Both;10 reps;Seated Quad Sets: AROM;Both;10 reps Heel Slides: AAROM;Left;10 reps;Seated Hip ABduction/ADduction: AAROM;Left;10 reps;Seated Long Arc Quad: AAROM;Left;10 reps;Seated Knee Flexion Limitations: pain Goniometric ROM: 5 to 50 degrees; limited by pain this session     General Comments        Pertinent Vitals/Pain Pain Assessment: 0-10 Pain Score: 4  Pain Location: Lt knee Pain Descriptors / Indicators: Sore Pain Intervention(s):  Monitored during session;Premedicated before session;Repositioned;Ice applied    Home Living  Prior Function            PT Goals (current goals can now be found in the care plan section) Acute Rehab PT Goals Patient Stated Goal: home today PT Goal Formulation: With patient Time For Goal Achievement: 02/11/14 Potential to Achieve Goals: Good Progress towards PT goals: Progressing toward goals    Frequency  7X/week    PT Plan Current plan remains appropriate    Co-evaluation             End of Session Equipment Utilized During Treatment: Gait belt;Left knee immobilizer Activity Tolerance: Patient tolerated treatment well Patient left: in chair;with call bell/phone within reach     Time: 0904-0930 PT Time Calculation (min) (ACUTE ONLY): 26 min  Charges:  $Gait Training: 8-22 mins $Therapeutic Exercise: 8-22 mins                    G CodesGustavus Bryant, [place]  [phone] 02/06/2014, 11:05 AM

## 2014-02-06 NOTE — Progress Notes (Signed)
Physical Therapy Treatment Patient Details Name: Michelle Crane MRN: [id number] DOB: 1955/11/23 Today's Date: 02/06/2014    History of Present Illness Admitted for L TKA    PT Comments    Pt much more unsteady and with AMS this session. Husband present to witness pt with decreased ability to follow commands and perform simple one step tasks safely. RN made aware and re-assessing at this time. Pt is a high fall risk. Pt unsafe at this time to D/C home. Educated husband on guarding technique incase pt insists on D/C home today. Will follow per POC.   Follow Up Recommendations  Home health PT;Supervision/Assistance - 24 hour     Equipment Recommendations  3in1 (PT)    Recommendations for Other Services       Precautions / Restrictions Precautions Precautions: Knee Precaution Comments: pt unable to call previous session from this morning  Required Braces or Orthoses: Knee Immobilizer - Left Knee Immobilizer - Left: On when out of bed or walking Restrictions Weight Bearing Restrictions: Yes LLE Weight Bearing: Weight bearing as tolerated    Mobility  Bed Mobility Overal bed mobility: Needs Assistance Bed Mobility: Supine to Sit;Sit to Supine     Supine to sit: Mod assist Sit to supine: Mod assist   General bed mobility comments: bed flattened to simulate home; pt having difficulty following commands for sequencing and requiring incr (A) from PT and husband this session; educated husband on technique to (A)   Transfers Overall transfer level: Needs assistance Equipment used: Rolling walker (2 wheeled) Transfers: Sit to/from Stand Sit to Stand: Min assist         General transfer comment: pt with delayed response to cues and requiring incr (A) to stand this session; pt unable to recall safe techniques from previous session; husband present and educated on techniques for transfers and hand placement   Ambulation/Gait Ambulation/Gait assistance: Min guard Ambulation  Distance (Feet): 50 Feet Assistive device: Rolling walker (2 wheeled) Gait Pattern/deviations: Step-to pattern;Decreased stance time - left;Decreased step length - right;Antalgic;Wide base of support;Trunk flexed;Leaning posteriorly Gait velocity: decreased Gait velocity interpretation: Below normal speed for age/gender General Gait Details: pt unsteady with gt and requiring max encouragement from PT and husband to increase gt distance; max cues for RW management    Stairs Stairs: Yes Stairs assistance: Min assist Stair Management: No rails;Step to pattern;Backwards;With walker Number of Stairs: 2 General stair comments: pt much more unsteady and requiring incr (A) and cues on stair management technique; husband present and educated on technique and safe guarding technique; given handout for carry over   Wheelchair Mobility    Modified Rankin (Stroke Patients Only)       Balance Overall balance assessment: Needs assistance Sitting-balance support: Feet supported;No upper extremity supported Sitting balance-Leahy Scale: Good     Standing balance support: During functional activity;Bilateral upper extremity supported Standing balance-Leahy Scale: Poor Standing balance comment: had multiple bouts of LOB posteriorly                     Cognition Arousal/Alertness: Lethargic;Suspect due to medications Behavior During Therapy: Flat affect Overall Cognitive Status: Impaired/Different from baseline Area of Impairment: Attention;Memory;Safety/judgement;Problem solving;Awareness;Following commands   Current Attention Level: Focused Memory: Decreased short-term memory;Decreased recall of precautions Following Commands: Follows one step commands with increased time;Follows one step commands inconsistently Safety/Judgement: Decreased awareness of deficits;Decreased awareness of safety Awareness: Intellectual Problem Solving: Slow processing;Requires verbal cues;Requires tactile  cues;Decreased initiation General Comments: pt not following commands this session  and requiring max redirection throughout for simple one step commands     Exercises      General Comments General comments (skin integrity, edema, etc.): discussed D/C recommendations and car transfer technique with pt; also discussed with husband pt AMS this session and inability to follow commands ; educated husband on donning/doffing of KI       Pertinent Vitals/Pain Pain Assessment: Faces Faces Pain Scale: Hurts whole lot Pain Location: Lt knee with movement  Pain Descriptors / Indicators: Guarding;Grimacing Pain Intervention(s): Monitored during session;Premedicated before session;Repositioned    Home Living                      Prior Function            PT Goals (current goals can now be found in the care plan section) Acute Rehab PT Goals Patient Stated Goal: home today PT Goal Formulation: With patient Time For Goal Achievement: 02/11/14 Potential to Achieve Goals: Good Progress towards PT goals: Progressing toward goals    Frequency  7X/week    PT Plan Current plan remains appropriate    Co-evaluation             End of Session Equipment Utilized During Treatment: Gait belt;Left knee immobilizer Activity Tolerance: Patient limited by fatigue;Patient limited by lethargy Patient left: in bed;with call bell/phone within reach;with family/visitor present     Time: 3893-7342 PT Time Calculation (min) (ACUTE ONLY): 28 min  Charges:  [employer] Training: 23-37 mins                    G CodesGustavus Bryant, [place]  [phone] 02/06/2014, 4:09 PM

## 2014-02-06 NOTE — Progress Notes (Signed)
Patient ID: Michelle Crane, female   DOB: March 07, 1955, 59 y.o.   MRN: [id number]     Subjective:  Patient reports pain as mild.  Patient in bed and in no acute distress.  Denies any CP or SOB  Objective:   VITALS:   Filed Vitals:   02/05/14 2309 02/06/14 0148 02/06/14 0433 02/06/14 0543  BP:  121/59  117/51  Pulse:  73  79  Temp: 99.1 F (37.3 C) 98.9 F (37.2 C) 100.7 F (38.2 C) 98.9 F (37.2 C)  TempSrc: Oral Oral Oral Oral  Resp:    14  Weight:      SpO2:  100%  98%    ABD soft Sensation intact distally Dorsiflexion/Plantar flexion intact Incision: dressing C/D/I and no drainage Good foot and ankle motion  Lab Results  Component Value Date   WBC 7.7 02/06/2014   HGB 12.0 02/06/2014   HCT 36.9 02/06/2014   MCV 94.4 02/06/2014   PLT 144* 02/06/2014   BMET    Component Value Date/Time   NA 130* 02/05/2014 0555   K 4.5 02/05/2014 0555   CL 92* 02/05/2014 0555   CO2 29 02/05/2014 0555   GLUCOSE 231* 02/05/2014 0555   BUN 9 02/05/2014 0555   CREATININE 1.06 02/05/2014 0555   CALCIUM 8.1* 02/05/2014 0555   GFRNONAA 55* 02/05/2014 0555   GFRAA 64* 02/05/2014 0555     Assessment/Plan: 2 Days Post-Op   Principal Problem:   Primary localized osteoarthritis of left knee Active Problems:   Knee osteoarthritis   Advance diet Up with therapy Plan for DC home tomorrow. Dry dressing PRN WBAT Follow up with Dr Mardelle Matte in two weeks   Michelle Crane 02/06/2014, 7:29 AM  Patient febrile to 104.  Hold dc, cxr and ua.  bs coverage at hs since it was 254 last night.    Marchia Bond, MD Cell [phone]

## 2014-02-06 NOTE — Progress Notes (Signed)
At 1301 patient had a fever of 100.8. Monitored patient. At 1434 patient's temperature was 103.1. MD notified. MD ordered a chest x-ray, and canceled discharge orders. Ice packs were placed on patient's groin and underarm, cool washcloths were placed on patient's forehead and neck, and the room temperature is now cooled. The patient was also given Tylenol. Will continue to monitor patient.

## 2014-02-07 LAB — GLUCOSE, CAPILLARY: GLUCOSE-CAPILLARY: 239 mg/dL — AB (ref 70–99)

## 2014-02-07 LAB — URINALYSIS, ROUTINE W REFLEX MICROSCOPIC
Bilirubin Urine: NEGATIVE
Glucose, UA: 250 mg/dL — AB
Hgb urine dipstick: NEGATIVE
Ketones, ur: NEGATIVE mg/dL
LEUKOCYTES UA: NEGATIVE
Nitrite: NEGATIVE
PROTEIN: NEGATIVE mg/dL
Specific Gravity, Urine: 1.016 (ref 1.005–1.030)
Urobilinogen, UA: 0.2 mg/dL (ref 0.0–1.0)
pH: 6 (ref 5.0–8.0)

## 2014-02-07 LAB — CBC
HCT: 31.4 % — ABNORMAL LOW (ref 36.0–46.0)
Hemoglobin: 10 g/dL — ABNORMAL LOW (ref 12.0–15.0)
MCH: 29.2 pg (ref 26.0–34.0)
MCHC: 31.8 g/dL (ref 30.0–36.0)
MCV: 91.8 fL (ref 78.0–100.0)
PLATELETS: 142 10*3/uL — AB (ref 150–400)
RBC: 3.42 MIL/uL — ABNORMAL LOW (ref 3.87–5.11)
RDW: 13.1 % (ref 11.5–15.5)
WBC: 8.6 10*3/uL (ref 4.0–10.5)

## 2014-02-07 NOTE — Progress Notes (Signed)
CARE MANAGEMENT NOTE 02/07/2014  Patient:  Michelle Crane, Michelle Crane   Account Number:  [id number]  Date Initiated:  02/05/2014  Documentation initiated by:  [facility]  Subjective/Objective Assessment:   s/p left TKA     Action/Plan:   PT/OT evals- recommended HHPT   Anticipated DC Date:  02/06/2014   Anticipated DC Plan:  [place]  CM consult      [facility] Choice  HOME HEALTH   Choice offered to / List presented to:  C-1 Patient        [place] arranged  HH-1 RN  HH-2 PT      Status of service:  Completed, signed off Medicare Important Message given?  YES (If response is "NO", the following Medicare IM given date fields will be blank) Date Medicare IM given:  02/07/2014 Medicare IM given by:  [facility] Date Additional Medicare IM given:   Additional Medicare IM given by:    Discharge Disposition:  [place]  Per UR Regulation:  Reviewed for med. necessity/level of care/duration of stay  If discussed at [place] of Stay Meetings, dates discussed:    Comments:  02/06/14 Faxed discharge summary and note from 02/06/14 to April at [place]. Contacted April and informed her that patient wil d/c on 02/07/14.  02/05/14 Patient chose [facility] spoke with Vaughan Basta at [place] they will not be able to work with her until 02/10/14. [place] [phone], spoke with April, they will be able to work with the patient starting 02/07/14. Set up [place] and [place]. Faxed facesheet, HH order, Face to face, H and P, and op note to [phone] and received confirmation. Patient states that she has Crane rolling walker and raised toilet seat, does not want Crane 3N1. No equipment needs identified. Will continue to follow. Fuller Plan RN, BSN

## 2014-02-07 NOTE — Progress Notes (Signed)
Physical Therapy Treatment Patient Details Name: KENNETH LAX MRN: [id number] DOB: 12/01/1955 Today's Date: 02/07/2014    History of Present Illness Admitted for L TKA    PT Comments    Much improved mentation today with therapy. Pt at supervision level for mobility. Reviewed stair management technique and car transfer technique. Pt ready from mobility standpoint to D/C home.   Follow Up Recommendations  Home health PT;Supervision/Assistance - 24 hour     Equipment Recommendations  3in1 (PT)    Recommendations for Other Services       Precautions / Restrictions Precautions Precautions: Knee Precaution Comments: reviewed no pillow under knee while resting  Required Braces or Orthoses: Knee Immobilizer - Left Knee Immobilizer - Left: On when out of bed or walking Restrictions Weight Bearing Restrictions: Yes LLE Weight Bearing: Weight bearing as tolerated    Mobility  Bed Mobility Overal bed mobility: Modified Independent Bed Mobility: Supine to Sit           General bed mobility comments: relying on handrail;   Transfers Overall transfer level: Needs assistance Equipment used: Rolling walker (2 wheeled) Transfers: Sit to/from Stand Sit to Stand: Supervision         General transfer comment: cues for sequencing   Ambulation/Gait Ambulation/Gait assistance: Supervision Ambulation Distance (Feet): 90 Feet (60', 30 ') Assistive device: Rolling walker (2 wheeled) Gait Pattern/deviations: Step-to pattern;Decreased stance time - left;Decreased step length - right;Narrow base of support;Antalgic Gait velocity: decreased Gait velocity interpretation: Below normal speed for age/gender General Gait Details: cues for upright posture and gt sequencing   Stairs Stairs: Yes Stairs assistance: Min guard Stair Management: No rails;Step to pattern;Backwards;With walker Number of Stairs: 2 General stair comments: cues for sequencing and technique   Wheelchair  Mobility    Modified Rankin (Stroke Patients Only)       Balance Overall balance assessment: Needs assistance Sitting-balance support: Feet supported;No upper extremity supported Sitting balance-Leahy Scale: Good     Standing balance support: During functional activity;Bilateral upper extremity supported Standing balance-Leahy Scale: Poor Standing balance comment: RW to balance                     Cognition Arousal/Alertness: Awake/alert Behavior During Therapy: WFL for tasks assessed/performed Overall Cognitive Status: Within Functional Limits for tasks assessed                 General Comments: pt alert and oriented this session     Exercises Total Joint Exercises Ankle Circles/Pumps: AROM;Both;10 reps;Seated Goniometric ROM: 0 to 60 degrees limited by pain     General Comments General comments (skin integrity, edema, etc.): reviewed car transfer technique       Pertinent Vitals/Pain Pain Assessment: 0-10 Pain Score: 6  Pain Location: lt knee with flexion Pain Descriptors / Indicators: Sore Pain Intervention(s): Monitored during session;Premedicated before session;Repositioned;Ice applied    Home Living                      Prior Function            PT Goals (current goals can now be found in the care plan section) Acute Rehab PT Goals Patient Stated Goal: home before lunch PT Goal Formulation: With patient Time For Goal Achievement: 02/11/14 Potential to Achieve Goals: Good Progress towards PT goals: Progressing toward goals    Frequency  7X/week    PT Plan Current plan remains appropriate    Co-evaluation  End of Session Equipment Utilized During Treatment: Gait belt;Left knee immobilizer Activity Tolerance: Patient tolerated treatment well Patient left: in chair;with call bell/phone within reach     Time: 0842-0906 PT Time Calculation (min) (ACUTE ONLY): 24 min  Charges:  [employer] Training: 23-37  mins                    G CodesGustavus Bryant, [place] 02/07/2014, 9:42 AM

## 2014-02-07 NOTE — Progress Notes (Signed)
Michelle Crane discharged to home per MD order. Discharge instructions reviewed and discussed with patient. All questions and concerns answered. Copy of instructions and scripts given to patient. IV removed.  Patient escorted to car by staff in a wheelchair. No distress noted upon discharge.   Tarri Abernethy R 02/07/2014 11:50 AM

## 2014-02-07 NOTE — Discharge Summary (Signed)
Physician Discharge Summary  Patient ID: DANAHI REDDISH MRN: [id number] DOB/AGE: August 21, 1955 59 y.o.  Admit date: 02/04/2014 Discharge date: 02/07/2014  Admission Diagnoses:  Primary localized osteoarthritis of left knee  Discharge Diagnoses:  Principal Problem:   Primary localized osteoarthritis of left knee Active Problems:   Knee osteoarthritis   Past Medical History  Diagnosis Date  . Complication of anesthesia   . PONV (postoperative nausea and vomiting)   . DDD (degenerative disc disease)   . Depression   . Chronic back pain   . GERD (gastroesophageal reflux disease)   . Carpal tunnel syndrome   . Carpal tunnel syndrome   . Hypertension     takes Lopressor daily  . Hyperlipidemia     doesn't take any meds for this;tries to control with diet  . Shortness of breath     with exertion  . Headache(784.0)     occasionally  . Joint pain   . Joint swelling   . Chronic back pain     spondylolisthesis/stenosis/radiculopathy/hnp  . Umbilical hernia     pt states that it doesn't bother her  . Urinary frequency   . History of bladder infections   . Yeast infection     history of;uses myccostatin cream prn  . Nocturia   . Diabetes mellitus     takes Amaryl daily  . Anxiety     takes Xanax prn  . Insomnia     takes trazodone nightly  . Type 2 diabetes mellitus   . Primary localized osteoarthritis of left knee 02/04/2014    Surgeries: Procedure(s): LEFT TOTAL KNEE ARTHROPLASTY on 02/04/2014   Consultants (if any):    Discharged Condition: Improved  Hospital Course: DORIEN BESSENT is an 59 y.o. female who was admitted 02/04/2014 with a diagnosis of Primary localized osteoarthritis of left knee and went to the operating room on 02/04/2014 and underwent the above named procedures.    She was given perioperative antibiotics:  Anti-infectives    Start     Dose/Rate Route Frequency Ordered Stop   02/04/14 1500  ceFAZolin (ANCEF) IVPB 2 g/50 mL premix     2 g100 mL/hr  over 30 Minutes Intravenous Every 6 hours 02/04/14 1436 02/04/14 2050   02/04/14 0600  ceFAZolin (ANCEF) IVPB 2 g/50 mL premix     2 g100 mL/hr over 30 Minutes Intravenous On call to O.R. 02/03/14 1323 02/04/14 0728   02/04/14 0550  ceFAZolin (ANCEF) 2-3 GM-% IVPB SOLR    Comments:  [name]   : cabinet override      02/04/14 0550 02/04/14 1759    .  She was given sequential compression devices, early ambulation, and xarelto for DVT prophylaxis. She did have substantial fever on postoperative day 2 and discharge was held, and workup included chest x-ray and urinalysis which was negative. She was subsequently afebrile. Her wound was pristine.  She benefited maximally from the hospital stay and there were no complications.    Recent vital signs:  Filed Vitals:   02/07/14 0453  BP: 132/58  Pulse: 82  Temp: 99.3 F (37.4 C)  Resp: 16    Recent laboratory studies:  Lab Results  Component Value Date   HGB 10.0* 02/07/2014   HGB 12.0 02/06/2014   HGB 11.4* 02/05/2014   Lab Results  Component Value Date   WBC 8.6 02/07/2014   PLT 142* 02/07/2014   Lab Results  Component Value Date   INR 1.02 01/22/2014   Lab Results  Component  Value Date   NA 130* 02/05/2014   K 4.5 02/05/2014   CL 92* 02/05/2014   CO2 29 02/05/2014   BUN 9 02/05/2014   CREATININE 1.06 02/05/2014   GLUCOSE 231* 02/05/2014    Discharge Medications:     Medication List    TAKE these medications        ALPRAZolam 1 MG tablet  Commonly known as:  XANAX  Take 1 mg by mouth 3 (three) times daily as needed. For anxiety.     citalopram 20 MG tablet  Commonly known as:  CELEXA  Take 20 mg by mouth daily.     HYDROcodone-acetaminophen 10-325 MG per tablet  Commonly known as:  NORCO  Take 1 tablet by mouth every 6 (six) hours as needed.     metFORMIN 500 MG 24 hr tablet  Commonly known as:  GLUCOPHAGE-XR  Take 500 mg by mouth daily with breakfast.     methocarbamol 500 MG tablet  Commonly  known as:  ROBAXIN  Take 1 tablet (500 mg total) by mouth every 6 (six) hours as needed.     metoprolol 100 MG tablet  Commonly known as:  LOPRESSOR  Take 100 mg by mouth 2 (two) times daily.     nystatin cream  Commonly known as:  MYCOSTATIN  Apply 1 application topically at bedtime. For chronic yeast infection.     omeprazole 40 MG capsule  Commonly known as:  PRILOSEC  Take 40 mg by mouth daily.     ondansetron 4 MG tablet  Commonly known as:  ZOFRAN  Take 1 tablet (4 mg total) by mouth every 8 (eight) hours as needed for nausea or vomiting.     oxyCODONE-acetaminophen 10-325 MG per tablet  Commonly known as:  PERCOCET  Take 1-2 tablets by mouth every 6 (six) hours as needed for pain. MAXIMUM TOTAL ACETAMINOPHEN DOSE IS 4000 MG PER DAY     rivaroxaban 10 MG Tabs tablet  Commonly known as:  XARELTO  Take 1 tablet (10 mg total) by mouth daily.     sennosides-docusate sodium 8.6-50 MG tablet  Commonly known as:  SENOKOT-S  Take 2 tablets by mouth daily.     SYSTANE 0.4-0.3 % Soln  Generic drug:  Polyethyl Glycol-Propyl Glycol  Apply 1 drop to eye 2 (two) times daily.     traZODone 50 MG tablet  Commonly known as:  DESYREL  Take 50 mg by mouth at bedtime.        Diagnostic Studies: Dg Chest 2 View  02/06/2014   CLINICAL DATA:  Initial evaluation for fever of unknown origin, personal history of hypertension and diabetes  EXAM: CHEST  2 VIEW  COMPARISON:  12/19/2011  FINDINGS: Mild to moderate cardiac enlargement. Vascular pattern normal. Right lung is clear. Mild interstitial change left lower lobe. This is slightly more prominent when compared to the prior study. No pleural effusions.  IMPRESSION: Very mild interstitial change in the left lower lobe most consistent with scarring or atelectasis. The possibility of developing pneumonia is not excluded and radiographic followup is suggested.   Electronically Signed   By: Skipper Cliche M.D.   On: 02/06/2014 17:26   Dg Knee  Left Port  02/04/2014   CLINICAL DATA:  59 year old status post knee replacement  EXAM: PORTABLE LEFT KNEE - 1-2 VIEW  COMPARISON:  None.  FINDINGS: The patient is status post left knee arthroplasty. The femoral and tibial components are well seated and approximated. There is no dislocation. Soft  tissue edema and subcutaneous emphysema are compatible with recent surgery.  IMPRESSION: Radiographically intact left knee replacement.   Electronically Signed   By: Rosemarie Ax   On: 02/04/2014 11:43    Disposition: 01-Home or Self Care        Follow-up Information    Follow up with Johnny Bridge, MD. Schedule an appointment as soon as possible for a visit in 2 weeks.   Specialty:  Orthopedic Surgery   Contact information:   [place] [address] [phone]        Signed: Johnny Bridge 02/07/2014, 7:30 AM

## 2014-02-07 NOTE — Progress Notes (Signed)
Patient ID: AISHA GREENBERGER, female   DOB: 20-Mar-1955, 59 y.o.   MRN: [id number]     Subjective:  Patient reports pain as mild.  Patient states that she is ready to go home if possible.  Denies any CP or SOB.  Objective:   VITALS:   Filed Vitals:   02/06/14 1815 02/06/14 1941 02/07/14 0107 02/07/14 0453  BP: 112/51 152/57 125/58 132/58  Pulse: 87 89 76 82  Temp: 98.6 F (37 C) 99.1 F (37.3 C) 99.4 F (37.4 C) 99.3 F (37.4 C)  TempSrc: Oral     Resp:  16 16 16   Weight:      SpO2:  90% 99% 98%    ABD soft Sensation intact distally Dorsiflexion/Plantar flexion intact Incision: dressing C/D/I and no drainage Dressing removed and wound has no signs of infection Dry dressing applied   Lab Results  Component Value Date   WBC 8.6 02/07/2014   HGB 10.0* 02/07/2014   HCT 31.4* 02/07/2014   MCV 91.8 02/07/2014   PLT 142* 02/07/2014   BMET    Component Value Date/Time   NA 130* 02/05/2014 0555   K 4.5 02/05/2014 0555   CL 92* 02/05/2014 0555   CO2 29 02/05/2014 0555   GLUCOSE 231* 02/05/2014 0555   BUN 9 02/05/2014 0555   CREATININE 1.06 02/05/2014 0555   CALCIUM 8.1* 02/05/2014 0555   GFRNONAA 55* 02/05/2014 0555   GFRAA 64* 02/05/2014 0555     Assessment/Plan: 3 Days Post-Op   Principal Problem:   Primary localized osteoarthritis of left knee Active Problems:   Knee osteoarthritis   Advance diet Up with therapy Discharge home with home health  Patient has transportation issues today so needs to be out by 11:00-12:00 today if possible WBAT Dry dressing PRN   [name] 02/07/2014, 7:18 AM  Discussed and agree with above. Discharge home. She has been afebrile and chest x-ray and urinalysis were negative.  Marchia Bond, MD Cell [phone]

## 2014-07-16 ENCOUNTER — Encounter (HOSPITAL_BASED_OUTPATIENT_CLINIC_OR_DEPARTMENT_OTHER): Payer: Self-pay | Admitting: *Deleted

## 2014-07-18 ENCOUNTER — Ambulatory Visit (HOSPITAL_BASED_OUTPATIENT_CLINIC_OR_DEPARTMENT_OTHER): Payer: Medicare Other | Admitting: Anesthesiology

## 2014-07-18 ENCOUNTER — Encounter (HOSPITAL_BASED_OUTPATIENT_CLINIC_OR_DEPARTMENT_OTHER)
Admission: RE | Disposition: A | Discharge status: Home / Self Care | Payer: Self-pay | Source: Ambulatory Visit | Attending: Orthopedic Surgery

## 2014-07-18 ENCOUNTER — Ambulatory Visit (HOSPITAL_BASED_OUTPATIENT_CLINIC_OR_DEPARTMENT_OTHER)
Admission: RE | Admit: 2014-07-18 | Discharge: 2014-07-18 | Disposition: A | Discharge status: Home / Self Care | Payer: Medicare Other | Source: Ambulatory Visit | Attending: Orthopedic Surgery | Admitting: Orthopedic Surgery

## 2014-07-18 ENCOUNTER — Encounter (HOSPITAL_BASED_OUTPATIENT_CLINIC_OR_DEPARTMENT_OTHER): Payer: Self-pay | Admitting: *Deleted

## 2014-07-18 DIAGNOSIS — F419 Anxiety disorder, unspecified: Secondary | ICD-10-CM | POA: Insufficient documentation

## 2014-07-18 DIAGNOSIS — E119 Type 2 diabetes mellitus without complications: Secondary | ICD-10-CM | POA: Insufficient documentation

## 2014-07-18 DIAGNOSIS — Z96652 Presence of left artificial knee joint: Secondary | ICD-10-CM | POA: Insufficient documentation

## 2014-07-18 DIAGNOSIS — K219 Gastro-esophageal reflux disease without esophagitis: Secondary | ICD-10-CM | POA: Diagnosis not present

## 2014-07-18 DIAGNOSIS — R351 Nocturia: Secondary | ICD-10-CM | POA: Diagnosis not present

## 2014-07-18 DIAGNOSIS — M549 Dorsalgia, unspecified: Secondary | ICD-10-CM | POA: Diagnosis not present

## 2014-07-18 DIAGNOSIS — F329 Major depressive disorder, single episode, unspecified: Secondary | ICD-10-CM | POA: Diagnosis not present

## 2014-07-18 DIAGNOSIS — M75121 Complete rotator cuff tear or rupture of right shoulder, not specified as traumatic: Secondary | ICD-10-CM | POA: Diagnosis not present

## 2014-07-18 DIAGNOSIS — G4700 Insomnia, unspecified: Secondary | ICD-10-CM | POA: Diagnosis not present

## 2014-07-18 DIAGNOSIS — I10 Essential (primary) hypertension: Secondary | ICD-10-CM | POA: Insufficient documentation

## 2014-07-18 DIAGNOSIS — M75101 Unspecified rotator cuff tear or rupture of right shoulder, not specified as traumatic: Secondary | ICD-10-CM | POA: Diagnosis present

## 2014-07-18 DIAGNOSIS — M66321 Spontaneous rupture of flexor tendons, right upper arm: Secondary | ICD-10-CM | POA: Insufficient documentation

## 2014-07-18 DIAGNOSIS — Z90710 Acquired absence of both cervix and uterus: Secondary | ICD-10-CM | POA: Diagnosis not present

## 2014-07-18 DIAGNOSIS — E785 Hyperlipidemia, unspecified: Secondary | ICD-10-CM | POA: Diagnosis not present

## 2014-07-18 DIAGNOSIS — G8929 Other chronic pain: Secondary | ICD-10-CM | POA: Diagnosis not present

## 2014-07-18 DIAGNOSIS — M24011 Loose body in right shoulder: Secondary | ICD-10-CM | POA: Insufficient documentation

## 2014-07-18 DIAGNOSIS — M7541 Impingement syndrome of right shoulder: Secondary | ICD-10-CM | POA: Insufficient documentation

## 2014-07-18 HISTORY — DX: Unspecified rotator cuff tear or rupture of right shoulder, not specified as traumatic: M75.101

## 2014-07-18 HISTORY — PX: RESECTION DISTAL CLAVICAL: SHX5053

## 2014-07-18 HISTORY — PX: SHOULDER ARTHROSCOPY WITH ROTATOR CUFF REPAIR AND SUBACROMIAL DECOMPRESSION: SHX5686

## 2014-07-18 LAB — POCT I-STAT, CHEM 8
BUN: 18 mg/dL (ref 6–20)
CHLORIDE: 102 mmol/L (ref 101–111)
Calcium, Ion: 1.09 mmol/L — ABNORMAL LOW (ref 1.13–1.30)
Creatinine, Ser: 0.6 mg/dL (ref 0.44–1.00)
Glucose, Bld: 258 mg/dL — ABNORMAL HIGH (ref 65–99)
HCT: 41 % (ref 36.0–46.0)
Hemoglobin: 13.9 g/dL (ref 12.0–15.0)
Potassium: 3.9 mmol/L (ref 3.5–5.1)
SODIUM: 139 mmol/L (ref 135–145)
TCO2: 25 mmol/L (ref 0–100)

## 2014-07-18 LAB — GLUCOSE, CAPILLARY: GLUCOSE-CAPILLARY: 273 mg/dL — AB (ref 65–99)

## 2014-07-18 SURGERY — SHOULDER ARTHROSCOPY WITH ROTATOR CUFF REPAIR AND SUBACROMIAL DECOMPRESSION
Anesthesia: General | Site: Shoulder | Laterality: Right

## 2014-07-18 MED ORDER — FENTANYL CITRATE (PF) 100 MCG/2ML IJ SOLN
INTRAMUSCULAR | Status: AC
  Filled 2014-07-18: qty 2

## 2014-07-18 MED ORDER — BACLOFEN 10 MG PO TABS: 10.0000 mg | ORAL_TABLET | Freq: Three times a day (TID) | ORAL | Status: DC

## 2014-07-18 MED ORDER — OXYCODONE HCL 5 MG PO TABS: 5.0000 mg | ORAL_TABLET | ORAL | Status: DC | PRN

## 2014-07-18 MED ORDER — HYDROCODONE-ACETAMINOPHEN 10-325 MG PO TABS: 1.0000 | ORAL_TABLET | Freq: Four times a day (QID) | ORAL | Status: DC | PRN

## 2014-07-18 MED ORDER — SODIUM CHLORIDE 0.9 % IR SOLN
Status: DC | PRN
  Administered 2014-07-18: 6000 mL

## 2014-07-18 MED ORDER — CEFAZOLIN SODIUM-DEXTROSE 2-3 GM-% IV SOLR
INTRAVENOUS | Status: AC
  Filled 2014-07-18: qty 50

## 2014-07-18 MED ORDER — HYDROMORPHONE HCL 1 MG/ML IJ SOLN
INTRAMUSCULAR | Status: AC
  Filled 2014-07-18: qty 1

## 2014-07-18 MED ORDER — SCOPOLAMINE 1 MG/3DAYS TD PT72
MEDICATED_PATCH | TRANSDERMAL | Status: AC
Start: 2014-07-18 — End: 2014-07-18
  Filled 2014-07-18: qty 1

## 2014-07-18 MED ORDER — ONDANSETRON HCL 4 MG PO TABS: 4.0000 mg | ORAL_TABLET | Freq: Three times a day (TID) | ORAL | Status: DC | PRN

## 2014-07-18 MED ORDER — MIDAZOLAM HCL 2 MG/2ML IJ SOLN
INTRAMUSCULAR | Status: AC
  Filled 2014-07-18: qty 2

## 2014-07-18 MED ORDER — FENTANYL CITRATE (PF) 100 MCG/2ML IJ SOLN
50.0000 ug | INTRAMUSCULAR | Status: DC | PRN
  Administered 2014-07-18: 100 ug via INTRAVENOUS

## 2014-07-18 MED ORDER — MIDAZOLAM HCL 2 MG/2ML IJ SOLN
1.0000 mg | INTRAMUSCULAR | Status: DC | PRN
  Administered 2014-07-18: 2 mg via INTRAVENOUS

## 2014-07-18 MED ORDER — HYDROMORPHONE HCL 1 MG/ML IJ SOLN
0.2500 mg | INTRAMUSCULAR | Status: DC | PRN
  Administered 2014-07-18: 0.5 mg via INTRAVENOUS
  Administered 2014-07-18 (×2): 0.25 mg via INTRAVENOUS

## 2014-07-18 MED ORDER — ROPIVACAINE HCL 5 MG/ML IJ SOLN
INTRAMUSCULAR | Status: DC | PRN
  Administered 2014-07-18: 25 mL via PERINEURAL

## 2014-07-18 MED ORDER — SUCCINYLCHOLINE CHLORIDE 20 MG/ML IJ SOLN
INTRAMUSCULAR | Status: DC | PRN
  Administered 2014-07-18: 140 mg via INTRAVENOUS

## 2014-07-18 MED ORDER — PROPOFOL 10 MG/ML IV BOLUS
INTRAVENOUS | Status: DC | PRN
  Administered 2014-07-18: 200 mg via INTRAVENOUS

## 2014-07-18 MED ORDER — INSULIN ASPART 100 UNIT/ML ~~LOC~~ SOLN
8.0000 [IU] | Freq: Once | SUBCUTANEOUS | Status: AC
  Administered 2014-07-18: 8 [IU] via SUBCUTANEOUS

## 2014-07-18 MED ORDER — DEXAMETHASONE SODIUM PHOSPHATE 4 MG/ML IJ SOLN
INTRAMUSCULAR | Status: DC | PRN
  Administered 2014-07-18: 10 mg via INTRAVENOUS

## 2014-07-18 MED ORDER — GLYCOPYRROLATE 0.2 MG/ML IJ SOLN: 0.2000 mg | Freq: Once | INTRAMUSCULAR | Status: DC | PRN

## 2014-07-18 MED ORDER — PROMETHAZINE HCL 25 MG/ML IJ SOLN
INTRAMUSCULAR | Status: AC
  Filled 2014-07-18: qty 1

## 2014-07-18 MED ORDER — PROMETHAZINE HCL 25 MG/ML IJ SOLN
6.2500 mg | INTRAMUSCULAR | Status: DC | PRN
  Administered 2014-07-18: 6.25 mg via INTRAVENOUS

## 2014-07-18 MED ORDER — LIDOCAINE-EPINEPHRINE (PF) 1.5 %-1:200000 IJ SOLN
INTRAMUSCULAR | Status: DC | PRN
  Administered 2014-07-18: 10 mL via PERINEURAL

## 2014-07-18 MED ORDER — EPHEDRINE SULFATE 50 MG/ML IJ SOLN
INTRAMUSCULAR | Status: DC | PRN
  Administered 2014-07-18: 10 mg via INTRAVENOUS

## 2014-07-18 MED ORDER — SCOPOLAMINE 1 MG/3DAYS TD PT72
1.0000 | MEDICATED_PATCH | Freq: Once | TRANSDERMAL | Status: AC | PRN
  Administered 2014-07-18: 1 via TRANSDERMAL

## 2014-07-18 MED ORDER — ONDANSETRON HCL 4 MG/2ML IJ SOLN
INTRAMUSCULAR | Status: DC | PRN
  Administered 2014-07-18 (×2): 4 mg via INTRAVENOUS

## 2014-07-18 MED ORDER — SENNA-DOCUSATE SODIUM 8.6-50 MG PO TABS: 2.0000 | ORAL_TABLET | Freq: Every day | ORAL | Status: DC

## 2014-07-18 MED ORDER — CEFAZOLIN SODIUM-DEXTROSE 2-3 GM-% IV SOLR
2.0000 g | INTRAVENOUS | Status: AC
  Administered 2014-07-18: 2 g via INTRAVENOUS

## 2014-07-18 MED ORDER — LACTATED RINGERS IV SOLN
INTRAVENOUS | Status: DC
  Administered 2014-07-18 (×3): via INTRAVENOUS

## 2014-07-18 MED ORDER — METOCLOPRAMIDE HCL 5 MG/ML IJ SOLN
INTRAMUSCULAR | Status: DC | PRN
  Administered 2014-07-18: 10 mg via INTRAVENOUS

## 2014-07-18 SURGICAL SUPPLY — 69 items
ANCHOR SUT BIO SW 4.75X19.1 (Anchor) ×9 IMPLANT
BLADE CUTTER GATOR 3.5 (BLADE) ×3 IMPLANT
BLADE GREAT WHITE 4.2 (BLADE) IMPLANT
BLADE GREAT WHITE 4.2MM (BLADE)
BLADE SURG 15 STRL LF DISP TIS (BLADE) IMPLANT
BLADE SURG 15 STRL SS (BLADE)
BUR OVAL 6.0 (BURR) ×3 IMPLANT
CANNULA 5.75X71 LONG (CANNULA) ×3 IMPLANT
CANNULA TWIST IN 8.25X7CM (CANNULA) ×6 IMPLANT
CHLORAPREP W/TINT 26ML (MISCELLANEOUS) ×3 IMPLANT
CLOSURE STERI-STRIP 1/2X4 (GAUZE/BANDAGES/DRESSINGS) ×1
CLSR STERI-STRIP ANTIMIC 1/2X4 (GAUZE/BANDAGES/DRESSINGS) ×2 IMPLANT
DECANTER SPIKE VIAL GLASS SM (MISCELLANEOUS) IMPLANT
DRAPE INCISE IOBAN 66X45 STRL (DRAPES) ×3 IMPLANT
DRAPE SHOULDER BEACH CHAIR (DRAPES) ×3 IMPLANT
DRAPE U 20/CS (DRAPES) ×3 IMPLANT
DRAPE U-SHAPE 47X51 STRL (DRAPES) ×3 IMPLANT
DRSG PAD ABDOMINAL 8X10 ST (GAUZE/BANDAGES/DRESSINGS) ×3 IMPLANT
DURAPREP 26ML APPLICATOR (WOUND CARE) IMPLANT
ELECT REM PT RETURN 9FT ADLT (ELECTROSURGICAL)
ELECTRODE REM PT RTRN 9FT ADLT (ELECTROSURGICAL) IMPLANT
FIBERSTICK 2 (SUTURE) IMPLANT
GAUZE SPONGE 4X4 12PLY STRL (GAUZE/BANDAGES/DRESSINGS) ×3 IMPLANT
GLOVE BIO SURGEON STRL SZ 6.5 (GLOVE) ×2 IMPLANT
GLOVE BIO SURGEON STRL SZ7 (GLOVE) ×3 IMPLANT
GLOVE BIO SURGEON STRL SZ8 (GLOVE) ×3 IMPLANT
GLOVE BIO SURGEONS STRL SZ 6.5 (GLOVE) ×1
GLOVE BIOGEL PI IND STRL 6.5 (GLOVE) ×1 IMPLANT
GLOVE BIOGEL PI IND STRL 7.5 (GLOVE) ×1 IMPLANT
GLOVE BIOGEL PI IND STRL 8 (GLOVE) ×2 IMPLANT
GLOVE BIOGEL PI INDICATOR 6.5 (GLOVE) ×2
GLOVE BIOGEL PI INDICATOR 7.5 (GLOVE) ×2
GLOVE BIOGEL PI INDICATOR 8 (GLOVE) ×4
GLOVE ORTHO TXT STRL SZ7.5 (GLOVE) ×3 IMPLANT
GOWN STRL REUS W/ TWL LRG LVL3 (GOWN DISPOSABLE) ×1 IMPLANT
GOWN STRL REUS W/ TWL XL LVL3 (GOWN DISPOSABLE) ×2 IMPLANT
GOWN STRL REUS W/TWL LRG LVL3 (GOWN DISPOSABLE) ×2
GOWN STRL REUS W/TWL XL LVL3 (GOWN DISPOSABLE) ×4
IMMOBILIZER SHOULDER FOAM XLGE (SOFTGOODS) IMPLANT
KIT SHOULDER TRACTION (DRAPES) ×3 IMPLANT
LASSO 90 CVE QUICKPAS (DISPOSABLE) ×3 IMPLANT
MANIFOLD NEPTUNE II (INSTRUMENTS) ×3 IMPLANT
NEEDLE SCORPION MULTI FIRE (NEEDLE) ×3 IMPLANT
PACK ARTHROSCOPY DSU (CUSTOM PROCEDURE TRAY) ×3 IMPLANT
PACK BASIN DAY SURGERY FS (CUSTOM PROCEDURE TRAY) ×3 IMPLANT
SET ARTHROSCOPY TUBING (MISCELLANEOUS) ×2
SET ARTHROSCOPY TUBING LN (MISCELLANEOUS) ×1 IMPLANT
SHEET MEDIUM DRAPE 40X70 STRL (DRAPES) ×3 IMPLANT
SLEEVE SCD COMPRESS KNEE MED (MISCELLANEOUS) ×3 IMPLANT
SLING ARM IMMOBILIZER LRG (SOFTGOODS) IMPLANT
SLING ARM IMMOBILIZER MED (SOFTGOODS) IMPLANT
SLING ARM LRG ADULT FOAM STRAP (SOFTGOODS) IMPLANT
SLING ARM MED ADULT FOAM STRAP (SOFTGOODS) IMPLANT
SLING ARM XL FOAM STRAP (SOFTGOODS) IMPLANT
SUT FIBERWIRE #2 38 T-5 BLUE (SUTURE)
SUT MNCRL AB 4-0 PS2 18 (SUTURE) ×3 IMPLANT
SUT PDS AB 1 CT  36 (SUTURE)
SUT PDS AB 1 CT 36 (SUTURE) IMPLANT
SUT TIGER TAPE 7 IN WHITE (SUTURE) ×3 IMPLANT
SUT VIC AB 3-0 SH 27 (SUTURE)
SUT VIC AB 3-0 SH 27X BRD (SUTURE) IMPLANT
SUTURE FIBERWR #2 38 T-5 BLUE (SUTURE) IMPLANT
TAPE FIBER 2MM 7IN #2 BLUE (SUTURE) ×6 IMPLANT
TOWEL OR 17X24 6PK STRL BLUE (TOWEL DISPOSABLE) ×3 IMPLANT
TOWEL OR NON WOVEN STRL DISP B (DISPOSABLE) ×3 IMPLANT
TUBE CONNECTING 20'X1/4 (TUBING)
TUBE CONNECTING 20X1/4 (TUBING) IMPLANT
WAND STAR VAC 90 (SURGICAL WAND) ×3 IMPLANT
WATER STERILE IRR 1000ML POUR (IV SOLUTION) ×3 IMPLANT

## 2014-07-18 NOTE — Anesthesia Preprocedure Evaluation (Signed)
Anesthesia Evaluation  Patient identified by MRN, date of birth, ID band Patient awake    Reviewed: Allergy & Precautions, NPO status , Patient's Chart, lab work & pertinent test results  History of Anesthesia Complications (+) PONV  Airway Mallampati: II  TM Distance: >3 FB Neck ROM: Full    Dental no notable dental hx.    Pulmonary neg pulmonary ROS,  breath sounds clear to auscultation  Pulmonary exam normal       Cardiovascular hypertension, Pt. on medications and Pt. on home beta blockers Normal cardiovascular examRhythm:Regular Rate:Normal     Neuro/Psych negative neurological ROS  negative psych ROS   GI/Hepatic negative GI ROS, Neg liver ROS,   Endo/Other  negative endocrine ROSdiabetes  Renal/GU negative Renal ROS  negative genitourinary   Musculoskeletal negative musculoskeletal ROS (+)   Abdominal   Peds negative pediatric ROS (+)  Hematology negative hematology ROS (+)   Anesthesia Other Findings   Reproductive/Obstetrics negative OB ROS                             Anesthesia Physical Anesthesia Plan  ASA: II  Anesthesia Plan: General   Post-op Pain Management:    Induction: Intravenous  Airway Management Planned: Oral ETT  Additional Equipment:   Intra-op Plan:   Post-operative Plan: Extubation in OR  Informed Consent: I have reviewed the patients History and Physical, chart, labs and discussed the procedure including the risks, benefits and alternatives for the proposed anesthesia with the patient or authorized representative who has indicated his/her understanding and acceptance.   Dental advisory given  Plan Discussed with: CRNA and Surgeon  Anesthesia Plan Comments:         Anesthesia Quick Evaluation

## 2014-07-18 NOTE — Anesthesia Procedure Notes (Addendum)
Anesthesia Regional Block:  Interscalene brachial plexus block  Pre-Anesthetic Checklist: ,, timeout performed, Correct Patient, Correct Site, Correct Laterality, Correct Procedure, Correct Position, site marked, Risks and benefits discussed,  Surgical consent,  Pre-op evaluation,  At surgeon's request and post-op pain management  Laterality: Right  Prep: chloraprep       Needles:  Injection technique: Single-shot  Needle Type: Echogenic Stimulator Needle     Needle Length: 9cm 9 cm Needle Gauge: 21 and 21 G    Additional Needles:  Procedures: ultrasound guided (picture in chart) Interscalene brachial plexus block Narrative:  Injection made incrementally with aspirations every 5 mL.  Performed by: Personally  Anesthesiologist: [name], [name]  Additional Notes: Patient tolerated the procedure well without complications   Procedure Name: Intubation Date/Time: 07/18/2014 12:56 PM Performed by: [name] Pre-anesthesia Checklist: Patient identified, Emergency Drugs available, Suction available and Patient being monitored Patient Re-evaluated:Patient Re-evaluated prior to inductionOxygen Delivery Method: Circle System Utilized Preoxygenation: Pre-oxygenation with 100% oxygen Intubation Type: IV induction, Cricoid Pressure applied and Rapid sequence Ventilation: Mask ventilation without difficulty Laryngoscope Size: Mac and 3 Grade View: Grade III Tube type: Oral Number of attempts: 1 Airway Equipment and Method: Stylet,  Oral airway and Bougie stylet Placement Confirmation: ETT inserted through vocal cords under direct vision,  positive ETCO2 and breath sounds checked- equal and bilateral Secured at: 22 cm Tube secured with: Tape Dental Injury: Teeth and Oropharynx as per pre-operative assessment

## 2014-07-18 NOTE — H&P (Signed)
PREOPERATIVE H&P  Chief Complaint: Right shoulder pain  HPI: Michelle Crane is a 59 y.o. female who presents for preoperative history and physical for right shoulder. She's had 10/10 pain is located laterally. She is right-hand dominant. She has had a Toradol injection as well as Depo-Medrol injection, that has helped, although the shoulder pain continues to be fairly significant. She has difficulty lifting, and difficulty with activities of daily living. She is frustrated and wants something done.  Past Medical History  Diagnosis Date  . Complication of anesthesia   . PONV (postoperative nausea and vomiting)   . DDD (degenerative disc disease)   . Depression   . Chronic back pain   . GERD (gastroesophageal reflux disease)   . Carpal tunnel syndrome   . Carpal tunnel syndrome   . Hypertension     takes Lopressor daily  . Hyperlipidemia     doesn't take any meds for this;tries to control with diet  . Shortness of breath     with exertion  . Headache(784.0)     occasionally  . Joint pain   . Joint swelling   . Chronic back pain     spondylolisthesis/stenosis/radiculopathy/hnp  . Umbilical hernia     pt states that it doesn't bother her  . Urinary frequency   . History of bladder infections   . Yeast infection     history of;uses myccostatin cream prn  . Nocturia   . Anxiety     takes Xanax prn  . Insomnia     takes trazodone nightly  . Primary localized osteoarthritis of left knee 02/04/2014  . Diabetes mellitus     metformin, sugars run 200's  . Type 2 diabetes mellitus    Past Surgical History  Procedure Laterality Date  . Cholecystectomy    . Carpal tunnel release  05/09/2011    Procedure: CARPAL TUNNEL RELEASE;  Surgeon: Tennis Must, MD;  Location: [facility];  Service: Orthopedics;  Laterality: Right;  . Tonsillectomy  at age 37  . Abdominal hysterectomy  70yrs ago  . Back surgery  7829,5621  . Posterior lumbar fusion 4 level  12/27/2011   Procedure: POSTERIOR LUMBAR FUSION 4 LEVEL;  Surgeon: Erline Levine, MD;  Location: [facility];  Service: Neurosurgery;  Laterality: Bilateral;  Lumbar two-three, three-four, four-five, lumbar five sacral one Redo decompression/Fusion  . Total knee arthroplasty Left 02/04/2014    Procedure: LEFT TOTAL KNEE ARTHROPLASTY;  Surgeon: Johnny Bridge, MD;  Location: [facility];  Service: Orthopedics;  Laterality: Left;  . Joint replacement  2011, 2016    rt total knee, lt TKR   History   Social History  . Marital Status: Married    Spouse Name: N/A  . Number of Children: N/A  . Years of Education: N/A   Social History Main Topics  . Smoking status: Never Smoker   . Smokeless tobacco: Not on file  . Alcohol Use: No  . Drug Use: No  . Sexual Activity: Not Currently    Birth Control/ Protection: Surgical   Other Topics Concern  . None   Social History Narrative   History reviewed. No pertinent family history. No Active Allergies Prior to Admission medications   Medication Sig Start Date End Date Taking? Authorizing Provider  ALPRAZolam Duanne Moron) 1 MG tablet Take 1 mg by mouth 3 (three) times daily as needed. For anxiety.   Yes Historical Provider, MD  HYDROcodone-acetaminophen (NORCO) 10-325 MG per tablet Take 1 tablet by mouth  every 6 (six) hours as needed.   Yes Historical Provider, MD  metFORMIN (GLUCOPHAGE-XR) 500 MG 24 hr tablet Take 500 mg by mouth daily with breakfast.   Yes Historical Provider, MD  metoprolol (LOPRESSOR) 100 MG tablet Take 100 mg by mouth 2 (two) times daily.   Yes Historical Provider, MD  omeprazole (PRILOSEC) 40 MG capsule Take 40 mg by mouth daily.   Yes Historical Provider, MD  nystatin cream (MYCOSTATIN) Apply 1 application topically at bedtime. For chronic yeast infection.    Historical Provider, MD  Polyethyl Glycol-Propyl Glycol (SYSTANE) 0.4-0.3 % SOLN Apply 1 drop to eye 2 (two) times daily.    Historical Provider, MD     Positive ROS: All other systems  have been reviewed and were otherwise negative with the exception of those mentioned in the HPI and as above.  Physical Exam: General: Alert, no acute distress Cardiovascular: No pedal edema Respiratory: No cyanosis, no use of accessory musculature GI: No organomegaly, abdomen is soft and non-tender Skin: No lesions in the area of chief complaint Neurologic: Sensation intact distally Psychiatric: Patient is competent for consent with normal mood and affect Lymphatic: No axillary or cervical lymphadenopathy  MUSCULOSKELETAL: Active motion of the right shoulder is 0-170 of passive motion, actively its 0-110. She has weakness with supraspinatus testing, as well as to some degree with infraspinatus testing. She does have some mild pain over the acromioclavicular joint.  Assessment: Right shoulder impingement syndrome with full-thickness rotator cuff tear, possible acromioclavicular joint disease  Plan: Plan for Procedure(s): RIGHT SHOULDER SCOPE SUBACROMIAL DECOMPRESSION/ROTATOR CUFF REPAIR , possible distal clavicle resection depending on operative findings  The risks benefits and alternatives were discussed with the patient including but not limited to the risks of nonoperative treatment, versus surgical intervention including infection, bleeding, nerve injury,  blood clots, cardiopulmonary complications, morbidity, mortality, among others, and they were willing to proceed. We've also discussed the risks for recurrent rupture, incomplete relief of symptoms, persistent pain. We have also discussed the possibility for biceps tenolysis depending on operative findings and the potential for Popeye sign.  Johnny Bridge, MD Cell [phone]   07/18/2014 7:19 AM

## 2014-07-18 NOTE — Anesthesia Postprocedure Evaluation (Signed)
  Anesthesia Post-op Note  Patient: Michelle Crane  Procedure(s) Performed: Procedure(s) (LRB): RIGHT SHOULDER SCOPE SUBACROMIAL DECOMPRESSION/ROTATOR CUFF REPAIR  (Right) RESECTION DISTAL CLAVICAL (Right)  Patient Location: PACU  Anesthesia Type: GA combined with regional for post-op pain  Level of Consciousness: awake and alert   Airway and Oxygen Therapy: Patient Spontanous Breathing  Post-op Pain: mild  Post-op Assessment: Post-op Vital signs reviewed, Patient's Cardiovascular Status Stable, Respiratory Function Stable, Patent Airway and No signs of Nausea or vomiting  Due to large amount of vomitus pre induction and amount of fluid obtained via OGT at end of case, I would suggest using ETT's and not Michelle LMA for her next anesthetic  Last Vitals:  Filed Vitals:   07/18/14 1527  BP: 126/71  Pulse: 89  Temp: 36.4 C  Resp: 16    Post-op Vital Signs: stable   Complications: No apparent anesthesia complications

## 2014-07-18 NOTE — Discharge Instructions (Signed)
Diet: As you were doing prior to hospitalization   Shower:  May shower but keep the wounds dry, use an occlusive plastic wrap, NO SOAKING IN TUB.  If the bandage gets wet, change with a clean dry gauze.  Dressing:  You may change your dressing 3-5 days after surgery.  Then change the dressing daily with sterile gauze dressing.    There are sticky tapes (steri-strips) on your wounds and all the stitches are absorbable.  Leave the steri-strips in place when changing your dressings, they will peel off with time, usually 2-3 weeks.  Activity:  Increase activity slowly as tolerated, but follow the weight bearing instructions below.  No lifting or driving for 6 weeks.  Weight Bearing:   Sling at all times..    To prevent constipation: you may use a stool softener such as -  Colace (over the counter) 100 mg by mouth twice a day  Drink plenty of fluids (prune juice may be helpful) and high fiber foods Miralax (over the counter) for constipation as needed.    Itching:  If you experience itching with your medications, try taking only a single pain pill, or even half a pain pill at a time.  You may take up to 10 pain pills per day, and you can also use benadryl over the counter for itching or also to help with sleep.   Precautions:  If you experience chest pain or shortness of breath - call 911 immediately for transfer to the hospital emergency department!!  If you develop a fever greater that 101 F, purulent drainage from wound, increased redness or drainage from wound, or calf pain -- Call the office at [phone]                                                Follow- Up Appointment:  Please call for an appointment to be seen in 2 weeks [facility] - [phone]   Regional Anesthesia Blocks  1. Numbness or the inability to move the "blocked" extremity may last from 3-48 hours after placement. The length of time depends on the medication injected and your individual response to the medication.  If the numbness is not going away after 48 hours, call your surgeon.  2. The extremity that is blocked will need to be protected until the numbness is gone and the  Strength has returned. Because you cannot feel it, you will need to take extra care to avoid injury. Because it may be weak, you may have difficulty moving it or using it. You may not know what position it is in without looking at it while the block is in effect.  3. For blocks in the legs and feet, returning to weight bearing and walking needs to be done carefully. You will need to wait until the numbness is entirely gone and the strength has returned. You should be able to move your leg and foot normally before you try and bear weight or walk. You will need someone to be with you when you first try to ensure you do not fall and possibly risk injury.  4. Bruising and tenderness at the needle site are common side effects and will resolve in a few days.  5. Persistent numbness or new problems with movement should be communicated to the surgeon or the [facility] [phone] [facility] (  [phone]).  Post Anesthesia Home Care Instructions  Activity: Get plenty of rest for the remainder of the day. A responsible adult should stay with you for 24 hours following the procedure.  For the next 24 hours, DO NOT: -Drive a car -Paediatric nurse -Drink alcoholic beverages -Take any medication unless instructed by your physician -Make any legal decisions or sign important papers.  Meals: Start with liquid foods such as gelatin or soup. Progress to regular foods as tolerated. Avoid greasy, spicy, heavy foods. If nausea and/or vomiting occur, drink only clear liquids until the nausea and/or vomiting subsides. Call your physician if vomiting continues.  Special Instructions/Symptoms: Your throat may feel dry or sore from the anesthesia or the breathing tube placed in your throat during surgery. If this causes  discomfort, gargle with warm salt water. The discomfort should disappear within 24 hours.  If you had a scopolamine patch placed behind your ear for the management of post- operative nausea and/or vomiting:  1. The medication in the patch is effective for 72 hours, after which it should be removed.  Wrap patch in a tissue and discard in the trash. Wash hands thoroughly with soap and water. 2. You may remove the patch earlier than 72 hours if you experience unpleasant side effects which may include dry mouth, dizziness or visual disturbances. 3. Avoid touching the patch. Wash your hands with soap and water after contact with the patch.

## 2014-07-18 NOTE — Op Note (Signed)
07/18/2014  3:07 PM  PATIENT:  Michelle Crane    PRE-OPERATIVE DIAGNOSIS:  Right rotator cuff tear with impingement syndrome and possible biceps tendinosis  POST-OPERATIVE DIAGNOSIS:  Right massive 3 tendon rotator cuff tear with early rotator cuff arthropathy, complete biceps tendon rupture, impingement syndrome, with loose body formation  PROCEDURE:  Right shoulder arthroscopy with extensive debridement, acromioplasty, repair of subscapularis, supraspinatus, and infraspinatus with chondroplasty of the glenoid and loose body removal  SURGEON:  Johnny Bridge, MD  PHYSICIAN ASSISTANT: Joya Gaskins, OPA-C, present and scrubbed throughout the case, critical for completion in a timely fashion, and for retraction, instrumentation, and closure.  ANESTHESIA:   General  PREOPERATIVE INDICATIONS:  Michelle Crane is a  59 y.o. female who had a massive rotator cuff tear and elected for surgical management.  The risks benefits and alternatives were discussed with the patient preoperatively including but not limited to the risks of infection, bleeding, nerve injury, cardiopulmonary complications, the need for revision surgery, among others, and the patient was willing to proceed. We also discussed the risks for recurrent rupture, progression of rotator cuff arthropathy, incomplete relief of symptoms, the need for future surgical intervention, among others.  OPERATIVE IMPLANTS: Arthrex bio composite 4.75 mm swivel lock 1 with an inverted fiber tape for the subscapularis, with an inverted fiber tape and FiberWire for the supraspinatus and an inverted fiber tape for the infraspinatus, with 2 4.75 mm bio composite swivel lock anchors above and one anchor anteriorly.  OPERATIVE FINDINGS: Shoulder had full motion during examination under anesthesia. The biceps tendon was completely ruptured. The subscapularis was extremely torn and retracted, and the subscapularis was mediocre quality. The supraspinatus  was torn beyond the level of the glenoid, and the infraspinatus it also pulled back to the level of the glenoid. There was evidence for the development of rotator cuff arthropathy with high riding humeral head as there was a "landing stripe" on the acromion that matched a patch of raw bone on the greater tuberosity.  The tendons had adequate mobility after anterior rotator interval slide to allow mobilization to the tuberosity, although it was a very difficult stretch. There was a fair amount of tendon tension on the repair. The bone quality was mediocre.  OPERATIVE PROCEDURE: The patient is brought to the operating room and placed in supine position. Gen. anesthesia was administered. IV antibiotics was given. The right upper extremity was prepped and draped in usual sterile fashion in a semilateral decubitus position after all bony prominences were padded and examination under anesthesia completed. Time out was performed. Diagnostic arthroscopy carried out with the above-named findings. I used the arthroscopic shaver to debride the remainder of the biceps tendon stump, as well as extensive debridement of the cartilage and the labrum. I actually used an arthroscopic basket to debride a area of grade 4 chondral damage on the glenoid anteriorly. There was also at least one large loose body, measuring about 1 cm x 0.75 cm, which was removed.  I placed 2 anterior cannulas, and used a curved suture passer to place an inverted horizontal mattress suture through the subscapularis and then brought that up to the subscapularis insertion at the lesser tuberosity using a 6-0 bur to prepare the bony bed for optimal healing.  I had excellent restoration of the subscapularis tension, which hopefully would reduce the tension off of my repair up top.  I then went to the subacromial space, performed a complete bursectomy, a light tuber plasty using a 6-0 bur. I  had multiple cannulas, and then placed an anterior fiber tape  through the anteriormost supraspinatus, and then a FiberWire posterior to that. I used a bird beak suture passer to pass the fiber tape into the infraspinatus.  I then placed a posterior superior cannula, and then anchored the posterior cuff back to the humeral head easing a swivel lock anchor. I then had effectively half of my territory covered, and then placed the anterior cannula and an anterior set of sutures into another swivel lock. Excellent re-apposition of the tendon to the bone was achieved. Having said that, this was a fairly heroic effort, and we will have to see whether or not this heals. If it fails then she may require reverse total shoulder replacement, versus extensive debridement as an interim procedure. I did perform a light acromioplasty although I did not take down the CA ligament. I viewed from the lateral, using the bur from posteriorly to remove the anterior lateral aspect of the acromion. I was very cautious and did not take much bone with this, in order to minimize the risk for future anterior escape.  The instruments removed, the portals closed with Monocryl followed by Steri-Strips and sterile gauze and she was awakened and returned to PACU in stable and satisfactory condition. There were no complications and she tolerated the procedure well.

## 2014-07-18 NOTE — Transfer of Care (Signed)
Immediate Anesthesia Transfer of Care Note  Patient: Michelle Crane  Procedure(s) Performed: Procedure(s) with comments: RIGHT SHOULDER SCOPE SUBACROMIAL DECOMPRESSION/ROTATOR CUFF REPAIR  (Right) - ANESTHESIA:  GENERAL, PRE/POST OP SCALENE RESECTION DISTAL CLAVICAL (Right)  Patient Location: PACU  Anesthesia Type:General and Regional  Level of Consciousness: sedated  Airway & Oxygen Therapy: Patient Spontanous Breathing and Patient connected to face mask oxygen  Post-op Assessment: Report given to RN and Post -op Vital signs reviewed and stable  Post vital signs: Reviewed and stable  Last Vitals:  Filed Vitals:   07/18/14 1150  BP:   Pulse: 58  Temp:   Resp: 17    Complications: No apparent anesthesia complications

## 2014-07-18 NOTE — Progress Notes (Signed)
Assisted Dr. [name] with right, ultrasound guided, interscalene  block. Side rails up, monitors on throughout procedure. See vital signs in flow sheet. Tolerated Procedure well. 

## 2014-07-21 ENCOUNTER — Encounter (HOSPITAL_BASED_OUTPATIENT_CLINIC_OR_DEPARTMENT_OTHER): Payer: Self-pay | Admitting: Orthopedic Surgery

## 2015-10-15 IMAGING — CR DG KNEE 1-2V PORT*L*
2 series · 2 of 2 positions shown · non-contrast
Comparison: None.

CLINICAL DATA: 59-year-old status post knee replacement

EXAM:
PORTABLE LEFT KNEE - 1-2 VIEW

[AP]
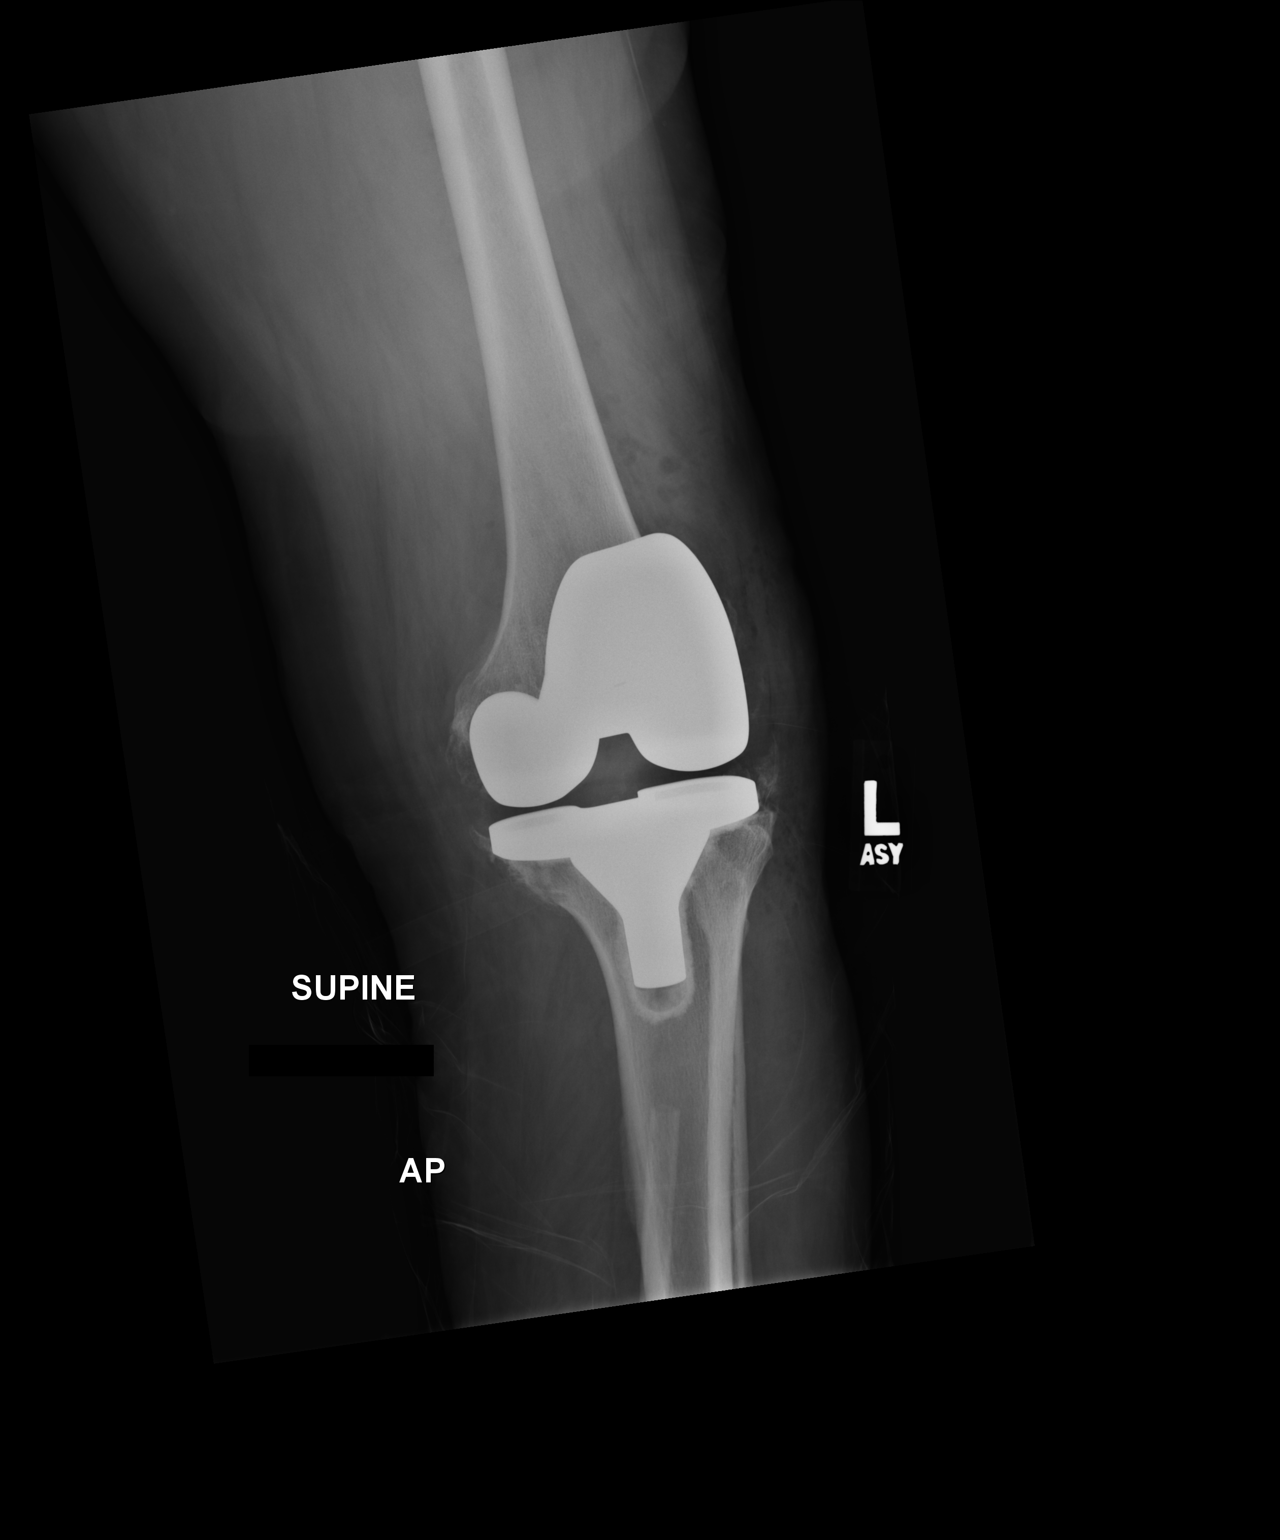

[xtable lateral]
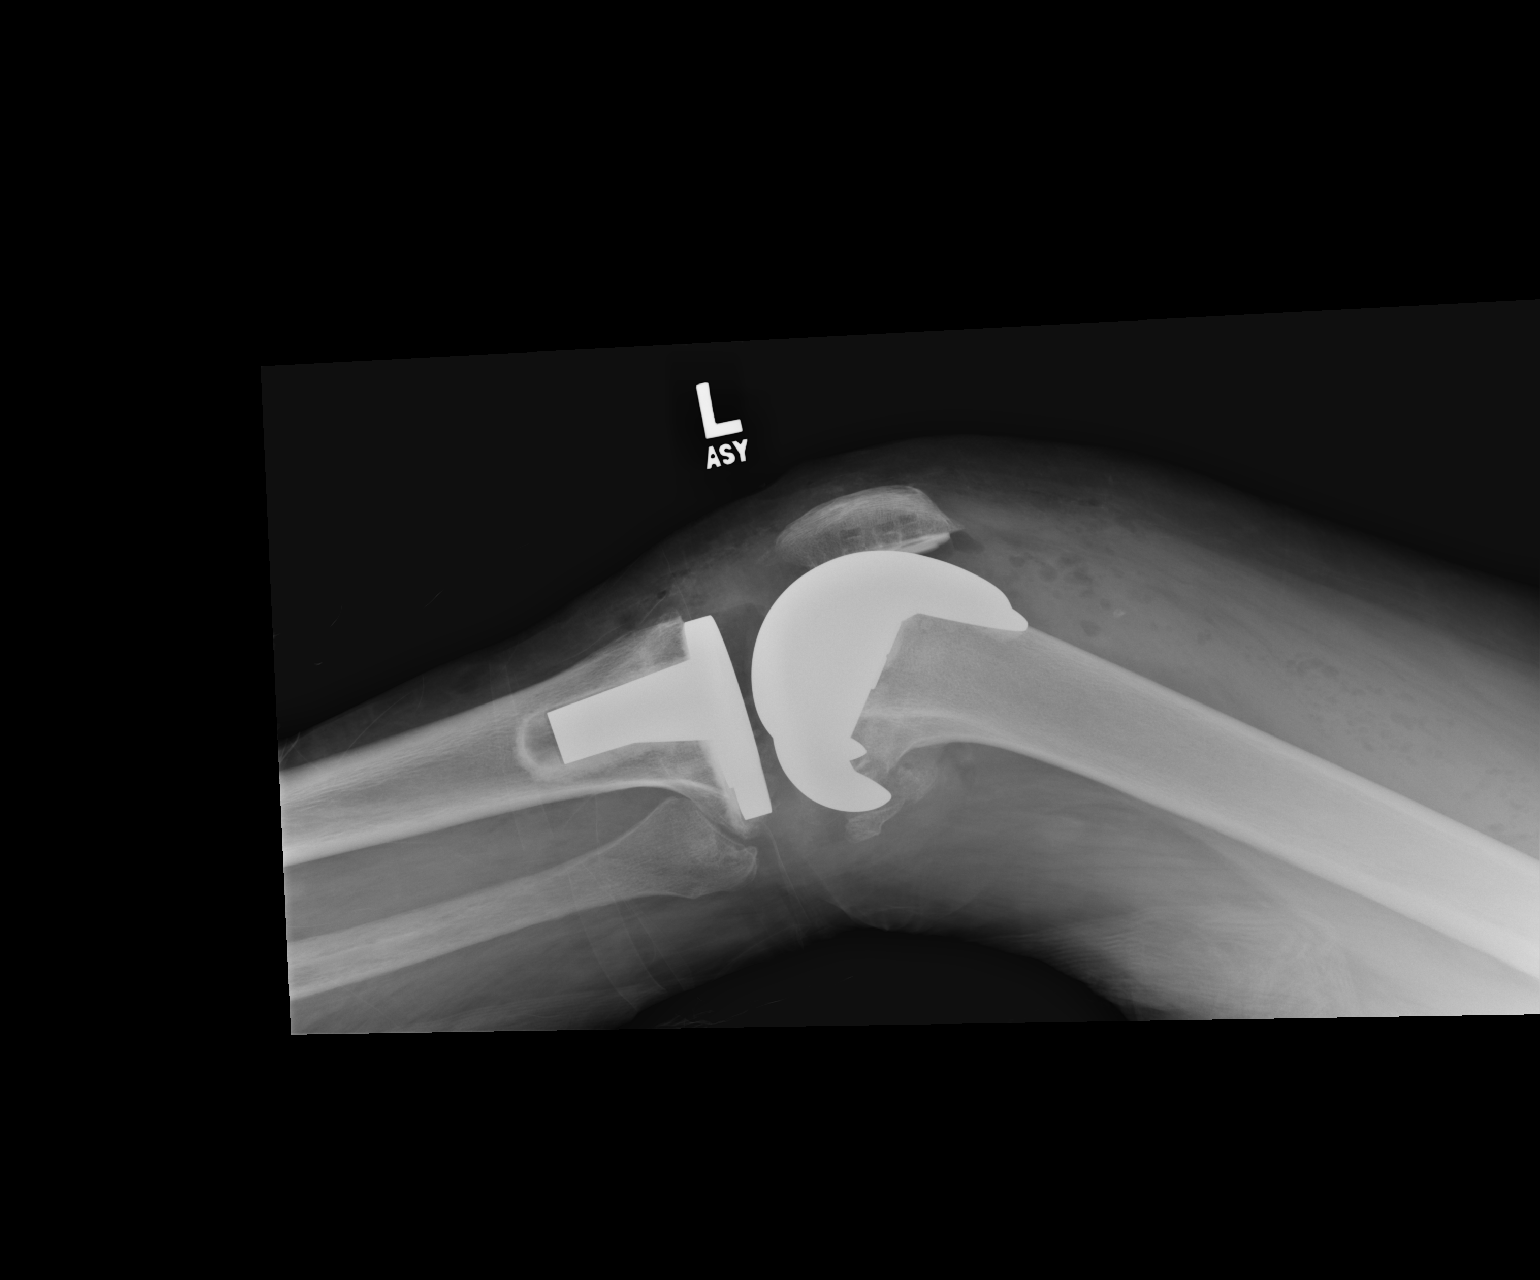

[2 of 2 positions shown; findings below may reference images not displayed]

FINDINGS: The patient is status post left knee arthroplasty. The femoral and
tibial components are well seated and approximated. There is no
dislocation. Soft tissue edema and subcutaneous emphysema are
compatible with recent surgery.
IMPRESSION: Radiographically intact left knee replacement.

## 2019-07-24 ENCOUNTER — Encounter (INDEPENDENT_AMBULATORY_CARE_PROVIDER_SITE_OTHER): Payer: Self-pay | Admitting: *Deleted

## 2020-11-05 ENCOUNTER — Encounter (INDEPENDENT_AMBULATORY_CARE_PROVIDER_SITE_OTHER): Payer: Self-pay | Admitting: *Deleted

## 2020-12-03 ENCOUNTER — Telehealth (INDEPENDENT_AMBULATORY_CARE_PROVIDER_SITE_OTHER): Payer: Self-pay

## 2020-12-03 ENCOUNTER — Encounter (INDEPENDENT_AMBULATORY_CARE_PROVIDER_SITE_OTHER): Payer: Self-pay

## 2020-12-03 ENCOUNTER — Encounter (INDEPENDENT_AMBULATORY_CARE_PROVIDER_SITE_OTHER): Payer: Self-pay | Admitting: Gastroenterology

## 2020-12-03 ENCOUNTER — Ambulatory Visit (INDEPENDENT_AMBULATORY_CARE_PROVIDER_SITE_OTHER): Payer: Medicare Other | Admitting: Gastroenterology

## 2020-12-03 ENCOUNTER — Other Ambulatory Visit: Payer: Self-pay

## 2020-12-03 DIAGNOSIS — Z800 Family history of malignant neoplasm of digestive organs: Secondary | ICD-10-CM | POA: Insufficient documentation

## 2020-12-03 DIAGNOSIS — Z79891 Long term (current) use of opiate analgesic: Secondary | ICD-10-CM | POA: Diagnosis not present

## 2020-12-03 DIAGNOSIS — R198 Other specified symptoms and signs involving the digestive system and abdomen: Secondary | ICD-10-CM | POA: Insufficient documentation

## 2020-12-03 DIAGNOSIS — R140 Abdominal distension (gaseous): Secondary | ICD-10-CM | POA: Diagnosis not present

## 2020-12-03 MED ORDER — COLESTIPOL HCL 1 G PO TABS: 2.0000 g | ORAL_TABLET | Freq: Two times a day (BID) | ORAL | 2 refills | Status: DC

## 2020-12-03 MED ORDER — PEG 3350-KCL-NA BICARB-NACL 420 G PO SOLR: 4000.0000 mL | ORAL | 0 refills | Status: DC

## 2020-12-03 NOTE — Telephone Encounter (Signed)
LeighAnn Joshus Rogan, CMA  

## 2020-12-03 NOTE — Progress Notes (Signed)
Michelle Crane, M.D. Gastroenterology & Hepatology [facility] [address] [place], [place] [address] Primary Care Physician: Lavonia Dana, MD [address] [place] [address]  Referring MD: PCP  Chief Complaint: Changes in bowel movements, abdominal pain  History of Present Illness: Michelle Crane is a 64 y.o. female with past medical history of anxiety, degenerative disc disease on opiate therapy, depression, diabetes, GERD, hyperlipidemia, hypertension, neuropathy due to diabetes, seizure disorder, who presents for evaluation of changes in bowel movements and abdominal pain  Patient reports that for the last 6 months she has presented intermittent episodes of diarrhea and constipation. She states that when she has diarrhea, she has to go to the restroom multiple times 5-6 times per day, which she describes as watery consistency without any blood or melena. This lasts for 2-3 days usually. After this, this is followed by constipation which lasts for 2-3 days as well.  Overall, she thinks that she is more frequently having diarrhea then constipation. She also endorses having abdominal pain in her lower abdomen and in the left side of abdomen, which is intermittent.  However she reports having frequent bloating and pressure in her abdomen.. Denies having fecal incotinence but has had fecal soiling as she has not been able to make it to the restroom on time.  Patient reported she has been feeling embarrassed as she has had to use diapers due to this.  Also has noticed that she has been passing gas without noticing which has made her very embarrassed.  Patient reports that she used to have a BM every day before her symptoms started. She sometimes feels nauseated but does not vomit.  The patient denies having any nausea, vomiting, fever, chills, hematochezia, melena, hematemesis, diarrhea, jaundice, pruritus or weight loss.  States that  she also noticed her umbilicus is larger than usual.  She states that after eating she has to go to the restroom immediately due to fecal urgency. She reports that she is passing gas frequently.  Due to his symptoms, the patient states "she had a scan in [place] center, was told she had fat in the pancreas". She believes it was a CT scan but no report is available. She believes it was performed in September.   Patient reports that she has been taking hydrocodone for many years, currently takes 2 pills a day.  Last KZS:WFUXN Last Colonoscopy:never  FHx: neg for any gastrointestinal/liver disease, Her daughter was diagnosed with colon cancer and passed away in 2020/02/18. She had stage IV colon cancer and died at age 102. Social: neg smoking, alcohol or illicit drug use Surgical: cystopexy c/b bowel surgery, cholecystectomy 10 years ago  Past Medical History: Past Medical History:  Diagnosis Date   Anxiety    takes Xanax prn   Carpal tunnel syndrome    Carpal tunnel syndrome    Chronic back pain    Chronic back pain    spondylolisthesis/stenosis/radiculopathy/hnp   Complication of anesthesia    DDD (degenerative disc disease)    Depression    Diabetes mellitus    metformin, sugars run 200's   GERD (gastroesophageal reflux disease)    Headache(784.0)    occasionally   History of bladder infections    Hyperlipidemia    doesn't take any meds for this;tries to control with diet   Hypertension    takes Lopressor daily   Insomnia    takes trazodone nightly   Joint pain    Joint  swelling    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)    Nocturia    PONV (postoperative nausea and vomiting)    Primary localized osteoarthritis of left knee 02/04/2014   Right rotator cuff tear, massive, three tendon 07/18/2014   Seizure disorder ([place])    Shortness of breath    with exertion   Type 2 diabetes mellitus ([place])    Umbilical hernia    pt states that it doesn't bother her    Urinary frequency    Yeast infection    history of;uses myccostatin cream prn    Past Surgical History: Past Surgical History:  Procedure Laterality Date   ABDOMINAL HYSTERECTOMY  54yrs ago   BACK SURGERY  [id number]   CARPAL TUNNEL RELEASE  05/09/2011   Procedure: CARPAL TUNNEL RELEASE;  Surgeon: Tennis Must, MD;  Location: [facility];  Service: Orthopedics;  Laterality: Right;   CHOLECYSTECTOMY     JOINT REPLACEMENT  2011, 2016   rt total knee, lt TKR   POSTERIOR LUMBAR FUSION 4 LEVEL  12/27/2011   Procedure: POSTERIOR LUMBAR FUSION 4 LEVEL;  Surgeon: Erline Levine, MD;  Location: [facility];  Service: Neurosurgery;  Laterality: Bilateral;  Lumbar two-three, three-four, four-five, lumbar five sacral one Redo decompression/Fusion   RESECTION DISTAL CLAVICAL Right 07/18/2014   Procedure: RESECTION DISTAL CLAVICAL;  Surgeon: Marchia Bond, MD;  Location: [facility];  Service: Orthopedics;  Laterality: Right;   SHOULDER ARTHROSCOPY WITH ROTATOR CUFF REPAIR AND SUBACROMIAL DECOMPRESSION Right 07/18/2014   Procedure: RIGHT SHOULDER SCOPE SUBACROMIAL DECOMPRESSION/ROTATOR CUFF REPAIR ;  Surgeon: Marchia Bond, MD;  Location: [facility];  Service: Orthopedics;  Laterality: Right;  ANESTHESIA:  GENERAL, PRE/POST OP SCALENE   TONSILLECTOMY  at age 16   TOTAL KNEE ARTHROPLASTY Left 02/04/2014   Procedure: LEFT TOTAL KNEE ARTHROPLASTY;  Surgeon: Johnny Bridge, MD;  Location: [facility];  Service: Orthopedics;  Laterality: Left;    Family History:History reviewed. No pertinent family history.  Social History: Social History   Tobacco Use  Smoking Status Never  Smokeless Tobacco Never   Social History   Substance and Sexual Activity  Alcohol Use No   Social History   Substance and Sexual Activity  Drug Use No    Allergies: No Known Allergies  Medications: Current Outpatient Medications  Medication Sig Dispense Refill   ALPRAZolam  (XANAX) 1 MG tablet Take 1 mg by mouth 2 (two) times daily as needed. For anxiety.     atorvastatin (LIPITOR) 40 MG tablet Take 40 mg by mouth daily.     gabapentin (NEURONTIN) 300 MG capsule TAKE ONE (1) CAPSULE BY MOUTH (3) TIMES DAILY     glipiZIDE (GLIPIZIDE XL) 10 MG 24 hr tablet Take 10 mg by mouth daily with breakfast.     HYDROcodone-acetaminophen (NORCO) 10-325 MG per tablet Take 1 tablet by mouth every 6 (six) hours as needed. 75 tablet 0   levETIRAcetam (KEPPRA) 1000 MG tablet Take 1,000 mg by mouth daily at 6 (six) AM.     METFORMIN HCL ER PO Take 1,000 mg by mouth in the morning and at bedtime.     metoprolol (LOPRESSOR) 100 MG tablet Take 100 mg by mouth 2 (two) times daily.     pioglitazone (ACTOS) 30 MG tablet Take 30 mg by mouth daily.     No current facility-administered medications for this visit.    Review of Systems: GENERAL: negative for malaise, night sweats HEENT: No changes in  hearing or vision, no nose bleeds or other nasal problems. NECK: Negative for lumps, goiter, pain and significant neck swelling RESPIRATORY: Negative for cough, wheezing CARDIOVASCULAR: Negative for chest pain, leg swelling, palpitations, orthopnea GI: SEE HPI MUSCULOSKELETAL: Negative for joint pain or swelling, back pain, and muscle pain. SKIN: Negative for lesions, rash PSYCH: Negative for sleep disturbance, mood disorder and recent psychosocial stressors. HEMATOLOGY Negative for prolonged bleeding, bruising easily, and swollen nodes. ENDOCRINE: Negative for cold or heat intolerance, polyuria, polydipsia and goiter. NEURO: negative for tremor, gait imbalance, syncope and seizures. The remainder of the review of systems is noncontributory.   Physical Exam: BP (!) 157/84 (BP Location: Left Arm, Patient Position: Sitting, Cuff Size: Large)   Pulse 64   Temp 98.4 F (36.9 C) (Oral)   Ht 5\' 1"  (1.549 m)   Wt 161 lb 14.4 oz (73.4 kg)   BMI 30.59 kg/m  GENERAL: The patient is AO x3, in  no acute distress. HEENT: Head is normocephalic and atraumatic. EOMI are intact. Mouth is well hydrated and without lesions. NECK: Supple. No masses LUNGS: Clear to auscultation. No presence of rhonchi/wheezing/rales. Adequate chest expansion HEART: RRR, normal s1 and s2. ABDOMEN: mildly tender upon palpation of the lower abdomen, no guarding, no peritoneal signs. Mildly distended in the abdomen diffusely. BS +. No masses. EXTREMITIES: Without any cyanosis, clubbing, rash, lesions or edema. NEUROLOGIC: AOx3, no focal motor deficit. SKIN: no jaundice, no rashes   Imaging/Labs: as above  I personally reviewed and interpreted the available labs, imaging and endoscopic files.  Impression and Plan: Michelle Crane is a 65 y.o. female with past medical history of anxiety, degenerative disc disease on opiate therapy, depression, diabetes, GERD, hyperlipidemia, hypertension, neuropathy due to diabetes, seizure disorder, who presents for evaluation of changes in bowel movements and abdominal pain.  The patient has presented recent onset of changes in her bowel movements of unclear etiology.  She has also presented bloating and abdominal pain without a clear explanation of her symptoms.  She reported having a CT scan recently which did not show any alteration but I do not have access to this at the moment.  I requested her to send a copy of the images so I can personally review them.  In the meantime, we will evaluate for metabolic causes and celiac disease with blood testing.  Even though her predominant symptom is diarrhea she is also presenting constipation, I do not consider this is related to an infectious cause.  I explained to her that her use of opiates could explain part of her symptoms, she may also have concomitant IBS-M.  However, if her symptoms persist, can consider a trial of Xifaxan in the future.  For now, we will attempt a trial with colestipol 2 g every day.  We will also evaluate her  symptoms further with a colonoscopy with possible random colonic biopsies.  She will need to continue having colonoscopies every 5 years at the very least as she has a first-degree family member with colon cancer before age 41.  - Schedule colonoscopy with random colonic biopsies - Start colestipol 2 g qday - check CBC, CMP, celiac disease panel and TSH - Patient to request CD with copy of the most recent CT scan and mail it to my office - If possible, try to minimize intake of opiates - RTC 3 months  All questions were answered.      Michelle Peppers, MD Gastroenterology and Hepatology [facility]

## 2020-12-03 NOTE — Patient Instructions (Addendum)
Schedule colonoscopy with random colonic biopsies Start colestipol 2 g qday Perform blood workup Please request CD with copy of the most recent CT scan and mail it to my office If possible, try to minimize intake of opiates

## 2020-12-03 NOTE — H&P (View-Only) (Signed)
[name], M.D. Gastroenterology & Hepatology [facility] [address] [place], [place] [address] Primary Care Physician: Lavonia Dana, MD [address] [place] [address]  Referring MD: PCP  Chief Complaint: Changes in bowel movements, abdominal pain  History of Present Illness: Michelle Crane is a 65 y.o. female with past medical history of anxiety, degenerative disc disease on opiate therapy, depression, diabetes, GERD, hyperlipidemia, hypertension, neuropathy due to diabetes, seizure disorder, who presents for evaluation of changes in bowel movements and abdominal pain  Patient reports that for the last 6 months she has presented intermittent episodes of diarrhea and constipation. She states that when she has diarrhea, she has to go to the restroom multiple times 5-6 times per day, which she describes as watery consistency without any blood or melena. This lasts for 2-3 days usually. After this, this is followed by constipation which lasts for 2-3 days as well.  Overall, she thinks that she is more frequently having diarrhea then constipation. She also endorses having abdominal pain in her lower abdomen and in the left side of abdomen, which is intermittent.  However she reports having frequent bloating and pressure in her abdomen.. Denies having fecal incotinence but has had fecal soiling as she has not been able to make it to the restroom on time.  Patient reported she has been feeling embarrassed as she has had to use diapers due to this.  Also has noticed that she has been passing gas without noticing which has made her very embarrassed.  Patient reports that she used to have a BM every day before her symptoms started. She sometimes feels nauseated but does not vomit.  The patient denies having any nausea, vomiting, fever, chills, hematochezia, melena, hematemesis, diarrhea, jaundice, pruritus or weight loss.  States that  she also noticed her umbilicus is larger than usual.  She states that after eating she has to go to the restroom immediately due to fecal urgency. She reports that she is passing gas frequently.  Due to his symptoms, the patient states "she had a scan in [facility], was told she had fat in the pancreas". She believes it was a CT scan but no report is available. She believes it was performed in September.   Patient reports that she has been taking hydrocodone for many years, currently takes 2 pills a day.  Last FYB:OFBPZ Last Colonoscopy:never  FHx: neg for any gastrointestinal/liver disease, Her daughter was diagnosed with colon cancer and passed away in March 06, 2020. She had stage IV colon cancer and died at age 36. Social: neg smoking, alcohol or illicit drug use Surgical: cystopexy c/b bowel surgery, cholecystectomy 10 years ago  Past Medical History: Past Medical History:  Diagnosis Date   Anxiety    takes Xanax prn   Carpal tunnel syndrome    Carpal tunnel syndrome    Chronic back pain    Chronic back pain    spondylolisthesis/stenosis/radiculopathy/hnp   Complication of anesthesia    DDD (degenerative disc disease)    Depression    Diabetes mellitus    metformin, sugars run 200's   GERD (gastroesophageal reflux disease)    Headache(784.0)    occasionally   History of bladder infections    Hyperlipidemia    doesn't take any meds for this;tries to control with diet   Hypertension    takes Lopressor daily   Insomnia    takes trazodone nightly   Joint pain    Joint  swelling    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)    Nocturia    PONV (postoperative nausea and vomiting)    Primary localized osteoarthritis of left knee 02/04/2014   Right rotator cuff tear, massive, three tendon 07/18/2014   Seizure disorder ([place])    Shortness of breath    with exertion   Type 2 diabetes mellitus ([place])    Umbilical hernia    pt states that it doesn't bother her    Urinary frequency    Yeast infection    history of;uses myccostatin cream prn    Past Surgical History: Past Surgical History:  Procedure Laterality Date   ABDOMINAL HYSTERECTOMY  65yrs ago   BACK SURGERY  8099,8338   CARPAL TUNNEL RELEASE  05/09/2011   Procedure: CARPAL TUNNEL RELEASE;  Surgeon: Tennis Must, MD;  Location: [facility];  Service: Orthopedics;  Laterality: Right;   CHOLECYSTECTOMY     JOINT REPLACEMENT  2011, 2016   rt total knee, lt TKR   POSTERIOR LUMBAR FUSION 4 LEVEL  12/27/2011   Procedure: POSTERIOR LUMBAR FUSION 4 LEVEL;  Surgeon: Erline Levine, MD;  Location: [facility];  Service: Neurosurgery;  Laterality: Bilateral;  Lumbar two-three, three-four, four-five, lumbar five sacral one Redo decompression/Fusion   RESECTION DISTAL CLAVICAL Right 07/18/2014   Procedure: RESECTION DISTAL CLAVICAL;  Surgeon: Marchia Bond, MD;  Location: [facility];  Service: Orthopedics;  Laterality: Right;   SHOULDER ARTHROSCOPY WITH ROTATOR CUFF REPAIR AND SUBACROMIAL DECOMPRESSION Right 07/18/2014   Procedure: RIGHT SHOULDER SCOPE SUBACROMIAL DECOMPRESSION/ROTATOR CUFF REPAIR ;  Surgeon: Marchia Bond, MD;  Location: [facility];  Service: Orthopedics;  Laterality: Right;  ANESTHESIA:  GENERAL, PRE/POST OP SCALENE   TONSILLECTOMY  at age 68   TOTAL KNEE ARTHROPLASTY Left 02/04/2014   Procedure: LEFT TOTAL KNEE ARTHROPLASTY;  Surgeon: Johnny Bridge, MD;  Location: [facility];  Service: Orthopedics;  Laterality: Left;    Family History:History reviewed. No pertinent family history.  Social History: Social History   Tobacco Use  Smoking Status Never  Smokeless Tobacco Never   Social History   Substance and Sexual Activity  Alcohol Use No   Social History   Substance and Sexual Activity  Drug Use No    Allergies: No Known Allergies  Medications: Current Outpatient Medications  Medication Sig Dispense Refill   ALPRAZolam  (XANAX) 1 MG tablet Take 1 mg by mouth 2 (two) times daily as needed. For anxiety.     atorvastatin (LIPITOR) 40 MG tablet Take 40 mg by mouth daily.     gabapentin (NEURONTIN) 300 MG capsule TAKE ONE (1) CAPSULE BY MOUTH (3) TIMES DAILY     glipiZIDE (GLIPIZIDE XL) 10 MG 24 hr tablet Take 10 mg by mouth daily with breakfast.     HYDROcodone-acetaminophen (NORCO) 10-325 MG per tablet Take 1 tablet by mouth every 6 (six) hours as needed. 75 tablet 0   levETIRAcetam (KEPPRA) 1000 MG tablet Take 1,000 mg by mouth daily at 6 (six) AM.     METFORMIN HCL ER PO Take 1,000 mg by mouth in the morning and at bedtime.     metoprolol (LOPRESSOR) 100 MG tablet Take 100 mg by mouth 2 (two) times daily.     pioglitazone (ACTOS) 30 MG tablet Take 30 mg by mouth daily.     No current facility-administered medications for this visit.    Review of Systems: GENERAL: negative for malaise, night sweats HEENT: No changes in  hearing or vision, no nose bleeds or other nasal problems. NECK: Negative for lumps, goiter, pain and significant neck swelling RESPIRATORY: Negative for cough, wheezing CARDIOVASCULAR: Negative for chest pain, leg swelling, palpitations, orthopnea GI: SEE HPI MUSCULOSKELETAL: Negative for joint pain or swelling, back pain, and muscle pain. SKIN: Negative for lesions, rash PSYCH: Negative for sleep disturbance, mood disorder and recent psychosocial stressors. HEMATOLOGY Negative for prolonged bleeding, bruising easily, and swollen nodes. ENDOCRINE: Negative for cold or heat intolerance, polyuria, polydipsia and goiter. NEURO: negative for tremor, gait imbalance, syncope and seizures. The remainder of the review of systems is noncontributory.   Physical Exam: BP (!) 157/84 (BP Location: Left Arm, Patient Position: Sitting, Cuff Size: Large)   Pulse 64   Temp 98.4 F (36.9 C) (Oral)   Ht 5\' 1"  (1.549 m)   Wt 161 lb 14.4 oz (73.4 kg)   BMI 30.59 kg/m  GENERAL: The patient is AO x3, in  no acute distress. HEENT: Head is normocephalic and atraumatic. EOMI are intact. Mouth is well hydrated and without lesions. NECK: Supple. No masses LUNGS: Clear to auscultation. No presence of rhonchi/wheezing/rales. Adequate chest expansion HEART: RRR, normal s1 and s2. ABDOMEN: mildly tender upon palpation of the lower abdomen, no guarding, no peritoneal signs. Mildly distended in the abdomen diffusely. BS +. No masses. EXTREMITIES: Without any cyanosis, clubbing, rash, lesions or edema. NEUROLOGIC: AOx3, no focal motor deficit. SKIN: no jaundice, no rashes   Imaging/Labs: as above  I personally reviewed and interpreted the available labs, imaging and endoscopic files.  Impression and Plan: Michelle Crane is a 65 y.o. female with past medical history of anxiety, degenerative disc disease on opiate therapy, depression, diabetes, GERD, hyperlipidemia, hypertension, neuropathy due to diabetes, seizure disorder, who presents for evaluation of changes in bowel movements and abdominal pain.  The patient has presented recent onset of changes in her bowel movements of unclear etiology.  She has also presented bloating and abdominal pain without a clear explanation of her symptoms.  She reported having a CT scan recently which did not show any alteration but I do not have access to this at the moment.  I requested her to send a copy of the images so I can personally review them.  In the meantime, we will evaluate for metabolic causes and celiac disease with blood testing.  Even though her predominant symptom is diarrhea she is also presenting constipation, I do not consider this is related to an infectious cause.  I explained to her that her use of opiates could explain part of her symptoms, she may also have concomitant IBS-M.  However, if her symptoms persist, can consider a trial of Xifaxan in the future.  For now, we will attempt a trial with colestipol 2 g every day.  We will also evaluate her  symptoms further with a colonoscopy with possible random colonic biopsies.  She will need to continue having colonoscopies every 5 years at the very least as she has a first-degree family member with colon cancer before age 72.  - Schedule colonoscopy with random colonic biopsies - Start colestipol 2 g qday - check CBC, CMP, celiac disease panel and TSH - Patient to request CD with copy of the most recent CT scan and mail it to my office - If possible, try to minimize intake of opiates - RTC 3 months  All questions were answered.      [name], MD Gastroenterology and Hepatology [facility]

## 2020-12-04 ENCOUNTER — Other Ambulatory Visit (INDEPENDENT_AMBULATORY_CARE_PROVIDER_SITE_OTHER): Payer: Self-pay

## 2020-12-04 DIAGNOSIS — R198 Other specified symptoms and signs involving the digestive system and abdomen: Secondary | ICD-10-CM

## 2020-12-04 DIAGNOSIS — Z800 Family history of malignant neoplasm of digestive organs: Secondary | ICD-10-CM

## 2020-12-09 ENCOUNTER — Encounter (INDEPENDENT_AMBULATORY_CARE_PROVIDER_SITE_OTHER): Payer: Self-pay

## 2020-12-11 LAB — CBC WITH DIFFERENTIAL/PLATELET
Absolute Monocytes: 703 cells/uL (ref 200–950)
Basophils Absolute: 78 cells/uL (ref 0–200)
Basophils Relative: 1.1 %
Eosinophils Absolute: 185 cells/uL (ref 15–500)
Eosinophils Relative: 2.6 %
HCT: 42.2 % (ref 35.0–45.0)
Hemoglobin: 14 g/dL (ref 11.7–15.5)
Lymphs Abs: 1974 cells/uL (ref 850–3900)
MCH: 30.1 pg (ref 27.0–33.0)
MCHC: 33.2 g/dL (ref 32.0–36.0)
MCV: 90.8 fL (ref 80.0–100.0)
MPV: 10.8 fL (ref 7.5–12.5)
Monocytes Relative: 9.9 %
Neutro Abs: 4161 cells/uL (ref 1500–7800)
Neutrophils Relative %: 58.6 %
Platelets: 207 10*3/uL (ref 140–400)
RBC: 4.65 10*6/uL (ref 3.80–5.10)
RDW: 13.1 % (ref 11.0–15.0)
Total Lymphocyte: 27.8 %
WBC: 7.1 10*3/uL (ref 3.8–10.8)

## 2020-12-11 LAB — COMPREHENSIVE METABOLIC PANEL
AG Ratio: 1.5 (calc) (ref 1.0–2.5)
ALT: 20 U/L (ref 6–29)
AST: 21 U/L (ref 10–35)
Albumin: 4.3 g/dL (ref 3.6–5.1)
Alkaline phosphatase (APISO): 50 U/L (ref 37–153)
BUN: 14 mg/dL (ref 7–25)
CO2: 29 mmol/L (ref 20–32)
Calcium: 9.6 mg/dL (ref 8.6–10.4)
Chloride: 96 mmol/L — ABNORMAL LOW (ref 98–110)
Creat: 0.66 mg/dL (ref 0.50–1.05)
Globulin: 2.9 g/dL (calc) (ref 1.9–3.7)
Glucose, Bld: 110 mg/dL — ABNORMAL HIGH (ref 65–99)
Potassium: 4.5 mmol/L (ref 3.5–5.3)
Sodium: 135 mmol/L (ref 135–146)
Total Bilirubin: 1.1 mg/dL (ref 0.2–1.2)
Total Protein: 7.2 g/dL (ref 6.1–8.1)

## 2020-12-11 LAB — CELIAC DISEASE PANEL
(tTG) Ab, IgA: <1 U/mL
(tTG) Ab, IgG: <1 U/mL
Gliadin IgA: <1 U/mL
Gliadin IgG: <1 U/mL
Immunoglobulin A: 225 mg/dL (ref 70–320)

## 2020-12-11 LAB — TSH: TSH: 2.08 mIU/L (ref 0.40–4.50)

## 2020-12-16 ENCOUNTER — Encounter (INDEPENDENT_AMBULATORY_CARE_PROVIDER_SITE_OTHER): Payer: Self-pay

## 2020-12-28 ENCOUNTER — Encounter (INDEPENDENT_AMBULATORY_CARE_PROVIDER_SITE_OTHER): Payer: Self-pay

## 2021-01-01 ENCOUNTER — Ambulatory Visit (HOSPITAL_COMMUNITY): Payer: Medicare Other | Admitting: Anesthesiology

## 2021-01-01 ENCOUNTER — Other Ambulatory Visit: Payer: Self-pay

## 2021-01-01 ENCOUNTER — Encounter (HOSPITAL_COMMUNITY): Payer: Self-pay | Admitting: Gastroenterology

## 2021-01-01 ENCOUNTER — Ambulatory Visit (HOSPITAL_COMMUNITY)
Admission: RE | Admit: 2021-01-01 | Discharge: 2021-01-01 | Disposition: A | Discharge status: Home / Self Care | Payer: Medicare Other | Source: Ambulatory Visit | Attending: Gastroenterology | Admitting: Gastroenterology

## 2021-01-01 ENCOUNTER — Encounter (HOSPITAL_COMMUNITY)
Admission: RE | Disposition: A | Discharge status: Home / Self Care | Payer: Self-pay | Source: Ambulatory Visit | Attending: Gastroenterology

## 2021-01-01 DIAGNOSIS — Z800 Family history of malignant neoplasm of digestive organs: Secondary | ICD-10-CM

## 2021-01-01 DIAGNOSIS — D649 Anemia, unspecified: Secondary | ICD-10-CM

## 2021-01-01 DIAGNOSIS — K635 Polyp of colon: Secondary | ICD-10-CM | POA: Insufficient documentation

## 2021-01-01 DIAGNOSIS — G40909 Epilepsy, unspecified, not intractable, without status epilepticus: Secondary | ICD-10-CM | POA: Diagnosis not present

## 2021-01-01 DIAGNOSIS — R197 Diarrhea, unspecified: Secondary | ICD-10-CM | POA: Diagnosis not present

## 2021-01-01 DIAGNOSIS — R194 Change in bowel habit: Secondary | ICD-10-CM | POA: Diagnosis present

## 2021-01-01 DIAGNOSIS — I10 Essential (primary) hypertension: Secondary | ICD-10-CM | POA: Insufficient documentation

## 2021-01-01 DIAGNOSIS — K649 Unspecified hemorrhoids: Secondary | ICD-10-CM | POA: Diagnosis not present

## 2021-01-01 DIAGNOSIS — D123 Benign neoplasm of transverse colon: Secondary | ICD-10-CM | POA: Diagnosis not present

## 2021-01-01 DIAGNOSIS — E1140 Type 2 diabetes mellitus with diabetic neuropathy, unspecified: Secondary | ICD-10-CM | POA: Diagnosis not present

## 2021-01-01 DIAGNOSIS — K219 Gastro-esophageal reflux disease without esophagitis: Secondary | ICD-10-CM | POA: Diagnosis not present

## 2021-01-01 DIAGNOSIS — D122 Benign neoplasm of ascending colon: Secondary | ICD-10-CM | POA: Diagnosis not present

## 2021-01-01 DIAGNOSIS — D124 Benign neoplasm of descending colon: Secondary | ICD-10-CM | POA: Insufficient documentation

## 2021-01-01 DIAGNOSIS — E785 Hyperlipidemia, unspecified: Secondary | ICD-10-CM | POA: Diagnosis not present

## 2021-01-01 DIAGNOSIS — R198 Other specified symptoms and signs involving the digestive system and abdomen: Secondary | ICD-10-CM

## 2021-01-01 HISTORY — PX: BIOPSY: SHX5522

## 2021-01-01 HISTORY — PX: POLYPECTOMY: SHX5525

## 2021-01-01 HISTORY — PX: COLONOSCOPY WITH PROPOFOL: SHX5780

## 2021-01-01 LAB — HM COLONOSCOPY

## 2021-01-01 LAB — GLUCOSE, CAPILLARY: Glucose-Capillary: 186 mg/dL — ABNORMAL HIGH (ref 70–99)

## 2021-01-01 SURGERY — COLONOSCOPY WITH PROPOFOL
Anesthesia: General

## 2021-01-01 MED ORDER — PROPOFOL 500 MG/50ML IV EMUL
INTRAVENOUS | Status: DC | PRN
  Administered 2021-01-01: 150 ug/kg/min via INTRAVENOUS

## 2021-01-01 MED ORDER — PROPOFOL 10 MG/ML IV BOLUS
INTRAVENOUS | Status: DC | PRN
  Administered 2021-01-01: 30 mg via INTRAVENOUS
  Administered 2021-01-01: 100 mg via INTRAVENOUS

## 2021-01-01 MED ORDER — LACTATED RINGERS IV SOLN: INTRAVENOUS | Status: DC

## 2021-01-01 MED ORDER — LIDOCAINE HCL (CARDIAC) PF 100 MG/5ML IV SOSY
PREFILLED_SYRINGE | INTRAVENOUS | Status: DC | PRN
  Administered 2021-01-01: 50 mg via INTRAVENOUS

## 2021-01-01 NOTE — Anesthesia Postprocedure Evaluation (Addendum)
Anesthesia Post Note  Patient: Michelle Crane  Procedure(s) Performed: COLONOSCOPY WITH PROPOFOL BIOPSY POLYPECTOMY  Patient location during evaluation: Endoscopy Anesthesia Type: General Level of consciousness: awake and alert and oriented Pain management: pain level controlled Vital Signs Assessment: post-procedure vital signs reviewed and stable Respiratory status: spontaneous breathing, nonlabored ventilation and respiratory function stable Cardiovascular status: blood pressure returned to baseline and stable Postop Assessment: no apparent nausea or vomiting Anesthetic complications: no   No notable events documented.   Last Vitals:  Vitals:   01/01/21 0654 01/01/21 0843  BP: (!) 183/89 134/79  Pulse: (!) 102   Resp: 17 (!) 23  Temp: 36.9 C 37 C  SpO2: 94% 96%    Last Pain:  Vitals:   01/01/21 0843  TempSrc: Oral  PainSc: 0-No pain                 Zayyan Mullen C Tyeesha Riker

## 2021-01-01 NOTE — Anesthesia Preprocedure Evaluation (Signed)
Anesthesia Evaluation  Patient identified by MRN, date of birth, ID band Patient awake    Reviewed: Allergy & Precautions, NPO status , Patient's Chart, lab work & pertinent test results, reviewed documented beta blocker date and time   History of Anesthesia Complications (+) PONV and history of anesthetic complications  Airway Mallampati: II  TM Distance: >3 FB Neck ROM: Full    Dental  (+) Upper Dentures, Dental Advisory Given   Pulmonary shortness of breath and with exertion,    Pulmonary exam normal breath sounds clear to auscultation       Cardiovascular Exercise Tolerance: Good hypertension, Pt. on medications and Pt. on home beta blockers Normal cardiovascular exam Rhythm:Regular Rate:Normal     Neuro/Psych  Headaches, Seizures -, Well Controlled,  PSYCHIATRIC DISORDERS Anxiety Depression  Neuromuscular disease    GI/Hepatic GERD  Medicated and Controlled,  Endo/Other  diabetes, Well Controlled, Type 2, Oral Hypoglycemic Agents  Renal/GU      Musculoskeletal  (+) Arthritis , Osteoarthritis,    Abdominal   Peds  Hematology negative hematology ROS (+)   Anesthesia Other Findings Back pain  Reproductive/Obstetrics negative OB ROS                             Anesthesia Physical Anesthesia Plan  ASA: 3  Anesthesia Plan: General   Post-op Pain Management: Minimal or no pain anticipated   Induction:   PONV Risk Score and Plan: TIVA  Airway Management Planned: Nasal Cannula and Natural Airway  Additional Equipment:   Intra-op Plan:   Post-operative Plan:   Informed Consent: I have reviewed the patients History and Physical, chart, labs and discussed the procedure including the risks, benefits and alternatives for the proposed anesthesia with the patient or authorized representative who has indicated his/her understanding and acceptance.     Dental advisory given  Plan  Discussed with: CRNA and Surgeon  Anesthesia Plan Comments:         Anesthesia Quick Evaluation

## 2021-01-01 NOTE — Transfer of Care (Signed)
Immediate Anesthesia Transfer of Care Note  Patient: Michelle Crane  Procedure(s) Performed: COLONOSCOPY WITH PROPOFOL BIOPSY POLYPECTOMY  Patient Location: Endoscopy Unit  Anesthesia Type:General  Level of Consciousness: awake and alert   Airway & Oxygen Therapy: Patient Spontanous Breathing  Post-op Assessment: Report given to RN and Post -op Vital signs reviewed and stable  Post vital signs: Reviewed and stable  Last Vitals:  Vitals Value Taken Time  BP    Temp    Pulse    Resp    SpO2      Last Pain:  Vitals:   01/01/21 0809  TempSrc:   PainSc: 9       Patients Stated Pain Goal: 10 (84/53/64 6803)  Complications: No notable events documented.

## 2021-01-01 NOTE — Anesthesia Procedure Notes (Signed)
Date/Time: 01/01/2021 8:13 AM Performed by: Orlie Dakin, CRNA Pre-anesthesia Checklist: Patient identified, Emergency Drugs available, Suction available and Patient being monitored Patient Re-evaluated:Patient Re-evaluated prior to induction Oxygen Delivery Method: Nasal cannula Induction Type: IV induction Placement Confirmation: positive ETCO2

## 2021-01-01 NOTE — Interval H&P Note (Signed)
History and Physical Interval Note:  01/01/2021 7:35 AM  Michelle Crane  has presented today for surgery, with the diagnosis of Family Hx of Colon Ca Change in bowel movements.  The various methods of treatment have been discussed with the patient and family. After consideration of risks, benefits and other options for treatment, the patient has consented to  Procedure(s) with comments: COLONOSCOPY WITH PROPOFOL (N/A) - 8:15 as a surgical intervention.  The patient's history has been reviewed, patient examined, no change in status, stable for surgery.  I have reviewed the patient's chart and labs.  Questions were answered to the patient's satisfaction.     [name]

## 2021-01-01 NOTE — Addendum Note (Signed)
Addendum  created 01/01/21 1335 by Denese Killings, MD   Clinical Note Signed

## 2021-01-01 NOTE — Op Note (Signed)
[facility] Patient Name: Michelle Crane Procedure Date: 01/01/2021 8:04 AM MRN: [id number] Date of Birth: 1956-01-10 Attending MD: [name] ,  CSN: [id number] Age: 65 Admit Type: Outpatient Procedure:                Colonoscopy Indications:              Change in bowel habits Providers:                [name], Rosina Lowenstein, RN, Raphael Gibney,                            Technician Referring MD:              Medicines:                Monitored Anesthesia Care Complications:            No immediate complications. Estimated Blood Loss:     Estimated blood loss: none. Procedure:                Pre-Anesthesia Assessment:                           - Prior to the procedure, a History and Physical                            was performed, and patient medications, allergies                            and sensitivities were reviewed. The patient's                            tolerance of previous anesthesia was reviewed.                           - The risks and benefits of the procedure and the                            sedation options and risks were discussed with the                            patient. All questions were answered and informed                            consent was obtained.                           - ASA Grade Assessment: II - A patient with mild                            systemic disease.                           After obtaining informed consent, the colonoscope                            was passed under direct vision. Throughout the  procedure, the patient's blood pressure, pulse, and                            oxygen saturations were monitored continuously. The                            [id number]-HQ190L ([id number]) scope was introduced through                            the anus and advanced to the the cecum, identified                            by appendiceal orifice and ileocecal valve. The                             colonoscopy was performed without difficulty. The                            patient tolerated the procedure well. The quality                            of the bowel preparation was adequate. Scope In: 8:11:44 AM Scope Out: 8:41:29 AM Scope Withdrawal Time: 0 hours 23 minutes 26 seconds  Total Procedure Duration: 0 hours 29 minutes 45 seconds  Findings:      Hemorrhoids were found on perianal exam.      Three sessile polyps were found in the transverse colon and ascending       colon. The polyps were 3 to 6 mm in size. These polyps were removed with       a cold snare. Resection and retrieval were complete.      A 3 mm polyp was found in the transverse colon. The polyp was sessile.       The polyp was removed with a cold snare. Resection was complete, but the       polyp tissue was not retrieved.      A 3 mm polyp was found in the descending colon. The polyp was sessile.       The polyp was removed with a cold snare. Resection and retrieval were       complete.      The rest of the colon appeared normal. Biopsies for histology were taken       with a cold forceps from the right colon and left colon for evaluation       of microscopic colitis.      The retroflexed view of the distal rectum and anal verge was normal and       showed no anal or rectal abnormalities. Impression:               - Hemorrhoids found on perianal exam.                           - Three 3 to 6 mm polyps in the transverse colon                            and in the ascending colon, removed with a cold  snare. Resected and retrieved.                           - One 3 mm polyp in the transverse colon, removed                            with a cold snare. Complete resection. Polyp tissue                            not retrieved.                           - One 3 mm polyp in the descending colon, removed                            with a cold snare. Resected and retrieved.                            - The rest of the examined colon is normal.                            Biopsied.                           - The distal rectum and anal verge are normal on                            retroflexion view. Moderate Sedation:      Per Anesthesia Care Recommendation:           - Discharge patient to home (ambulatory).                           - Resume previous diet.                           - Await pathology results.                           - Repeat colonoscopy in 3 years for surveillance.                           - Continue colestipol. Procedure Code(s):        --- Professional ---                           [phone], Colonoscopy, flexible; with removal of                            tumor(s), polyp(s), or other lesion(s) by snare                            technique                           24580, 38, Colonoscopy, flexible; with biopsy,  single or multiple Diagnosis Code(s):        --- Professional ---                           K63.5, Polyp of colon                           K64.9, Unspecified hemorrhoids                           R19.4, Change in bowel habit CPT copyright 2019 [employer]. All rights reserved. The codes documented in this report are preliminary and upon coder review may  be revised to meet current compliance requirements. [name], MD [name],  01/01/2021 8:48:54 AM This report has been signed electronically. Number of Addenda: 0

## 2021-01-01 NOTE — Discharge Instructions (Addendum)
You are being discharged to home.  Resume your previous diet.  We are waiting for your pathology results.  Your physician has recommended a repeat colonoscopy in three years for surveillance.  

## 2021-01-04 ENCOUNTER — Encounter (INDEPENDENT_AMBULATORY_CARE_PROVIDER_SITE_OTHER): Payer: Self-pay | Admitting: *Deleted

## 2021-01-04 LAB — SURGICAL PATHOLOGY

## 2021-01-05 ENCOUNTER — Encounter (HOSPITAL_COMMUNITY): Payer: Self-pay | Admitting: Gastroenterology

## 2021-02-24 ENCOUNTER — Other Ambulatory Visit (INDEPENDENT_AMBULATORY_CARE_PROVIDER_SITE_OTHER): Payer: Self-pay | Admitting: Gastroenterology

## 2021-02-24 DIAGNOSIS — R198 Other specified symptoms and signs involving the digestive system and abdomen: Secondary | ICD-10-CM

## 2021-02-24 NOTE — Telephone Encounter (Signed)
Seen 12/03/20.

## 2021-03-18 ENCOUNTER — Ambulatory Visit (INDEPENDENT_AMBULATORY_CARE_PROVIDER_SITE_OTHER): Payer: Medicare Other | Admitting: Gastroenterology

## 2021-04-12 ENCOUNTER — Other Ambulatory Visit: Payer: Self-pay

## 2021-04-12 ENCOUNTER — Encounter (INDEPENDENT_AMBULATORY_CARE_PROVIDER_SITE_OTHER): Payer: Self-pay | Admitting: Gastroenterology

## 2021-04-12 ENCOUNTER — Ambulatory Visit (INDEPENDENT_AMBULATORY_CARE_PROVIDER_SITE_OTHER): Payer: Medicare Other | Admitting: Gastroenterology

## 2021-04-12 VITALS — BP 167/84 | HR 55 | Temp 98.6°F | Ht 61.0 in | Wt 166.1 lb

## 2021-04-12 DIAGNOSIS — R197 Diarrhea, unspecified: Secondary | ICD-10-CM

## 2021-04-12 DIAGNOSIS — Z800 Family history of malignant neoplasm of digestive organs: Secondary | ICD-10-CM | POA: Diagnosis not present

## 2021-04-12 MED ORDER — COLESTIPOL HCL 1 G PO TABS: 2.0000 g | ORAL_TABLET | Freq: Two times a day (BID) | ORAL | 2 refills | Status: DC

## 2021-04-12 NOTE — Progress Notes (Signed)
? ?Referring Provider: Lavonia Dana, MD ?Primary Care Physician:  Lavonia Dana, MD ?Primary GI Physician: castaneda ? ?Chief Complaint  ?Patient presents with  ? Follow-up  ?  Follow up on diarrhea, constipation and abdominal pain. States she is doing better. Taking questran 2 bid but cost $61 every 2 weeks.   ? ?HPI:   ?Michelle Crane is a 66 y.o. female with past medical history of anxiety, GERD, HLD, HTN, DM, seizure disorder, neuropathy.  ? ?Patient presenting today for follow up, last seen November 2022 with intermittent diarrhea and constipation with 5-6 BMs per day, watery in consistency, usually lasts 2-3 days. Also endorsed lower left abdominal pain. Having fecal urgency shortly after eating and passing gas frequently. Patient started on colestipol 2g daily, CBC, CMP, TSH and celiac disease panel ordered, scheduled for colonoscopy with random colonic bx.  ? ?She states that colestipol 2g BID is working well for her, she is having 2 BMs per day without constipation or diarrhea. She does endorse that she is paying around $120 per month for this medication as she does not have prescription drug coverage. She denies any abdominal pain, melena, rectal bleeding. No changes In appetite or weight loss. No nausea or vomiting. Denies dysphagia or odynophagia.  ? ?Notably, she does tell me that her daughter passed away last year at age 52 of stage 4 colon cancer, had recently had a colonoscopy with no indications of presence of CRC.  ? ?Last Colonoscopy:01/01/21- Hemorrhoids found on perianal exam. ?- Three 3 to 6 mm polyps in the transverse colon and in the ascending colon-all tubular adenomas ?- One 3 mm polyp in the transverse colon-tubular adenoma ?- One 3 mm polyp in the descending colon-tubular adenoma ?- The rest of the examined colon is normal. Biopsied-normal biopsies ?- The distal rectum and anal verge are normal on retroflexion view. ?Last Endoscopy:never ? ?Recommendations:  ?Repeat in 3  years ? ?Past Medical History:  ?Diagnosis Date  ? Anxiety   ? takes Xanax prn  ? Carpal tunnel syndrome   ? Carpal tunnel syndrome   ? Chronic back pain   ? Chronic back pain   ? spondylolisthesis/stenosis/radiculopathy/hnp  ? Complication of anesthesia   ? DDD (degenerative disc disease)   ? Depression   ? Diabetes mellitus   ? metformin, sugars run 200's  ? GERD (gastroesophageal reflux disease)   ? Headache(784.0)   ? occasionally  ? History of bladder infections   ? Hyperlipidemia   ? doesn't take any meds for this;tries to control with diet  ? Hypertension   ? takes Lopressor daily  ? Insomnia   ? takes trazodone nightly  ? Joint pain   ? Joint swelling   ? Neuropathy due to secondary diabetes mellitus ([place])   ? Nocturia   ? PONV (postoperative nausea and vomiting)   ? Primary localized osteoarthritis of left knee 02/04/2014  ? Right rotator cuff tear, massive, three tendon 07/18/2014  ? Seizure disorder ([place])   ? Shortness of breath   ? with exertion  ? Type 2 diabetes mellitus ([place])   ? Umbilical hernia   ? pt states that it doesn't bother her  ? Urinary frequency   ? Yeast infection   ? history of;uses myccostatin cream prn  ? ? ?Past Surgical History:  ?Procedure Laterality Date  ? ABDOMINAL HYSTERECTOMY  66yr ago  ? BACK SURGERY  [phone] ? BIOPSY  01/01/2021  ? Procedure: BIOPSY;  Surgeon: CHarvel Quale  MD;  [facility];  Service: Gastroenterology;;  ? CARPAL TUNNEL RELEASE  05/09/2011  ? Procedure: CARPAL TUNNEL RELEASE;  Surgeon: Tennis Must, MD;  Location: [place];  Service: Orthopedics;  Laterality: Right;  ? CHOLECYSTECTOMY    ? COLONOSCOPY WITH PROPOFOL N/A 01/01/2021  ? Procedure: COLONOSCOPY WITH PROPOFOL;  Surgeon: Harvel Quale, MD;  [facility];  Service: Gastroenterology;  Laterality: N/A;  8:15  ? JOINT REPLACEMENT  2011, 2016  ? rt total knee, lt TKR  ? POLYPECTOMY  01/01/2021  ? Procedure: POLYPECTOMY;  Surgeon: Harvel Quale, MD;  [facility];  Service: Gastroenterology;;  ? POSTERIOR LUMBAR FUSION 4 LEVEL  12/27/2011  ? Procedure: POSTERIOR LUMBAR FUSION 4 LEVEL;  Surgeon: Erline Levine, MD;  Location: [facility];  Service: Neurosurgery;  Laterality: Bilateral;  Lumbar two-three, three-four, four-five, lumbar five sacral one Redo decompression/Fusion  ? RESECTION DISTAL CLAVICAL Right 07/18/2014  ? Procedure: RESECTION DISTAL CLAVICAL;  Surgeon: Marchia Bond, MD;  Location: [place];  Service: Orthopedics;  Laterality: Right;  ? SHOULDER ARTHROSCOPY WITH ROTATOR CUFF REPAIR AND SUBACROMIAL DECOMPRESSION Right 07/18/2014  ? Procedure: RIGHT SHOULDER SCOPE SUBACROMIAL DECOMPRESSION/ROTATOR CUFF REPAIR ;  Surgeon: Marchia Bond, MD;  Location: [place];  Service: Orthopedics;  Laterality: Right;  ANESTHESIA:  GENERAL, PRE/POST OP SCALENE  ? TONSILLECTOMY  at age 10  ? TOTAL KNEE ARTHROPLASTY Left 02/04/2014  ? Procedure: LEFT TOTAL KNEE ARTHROPLASTY;  Surgeon: Johnny Bridge, MD;  Location: [place];  Service: Orthopedics;  Laterality: Left;  ? ? ?Current Outpatient Medications  ?Medication Sig Dispense Refill  ? ALPRAZolam (XANAX) 1 MG tablet Take 1 mg by mouth 2 (two) times daily as needed. For anxiety.    ? atorvastatin (LIPITOR) 40 MG tablet Take 40 mg by mouth daily.    ? colestipol (COLESTID) 1 g tablet TAKE 2 TABLETS (2 G TOTAL) BY MOUTH 2 (TWO) TIMES DAILY TAKE 4 HOURS APART FROM OTHER MEDICATIONS 60 tablet 2  ? gabapentin (NEURONTIN) 300 MG capsule TAKE ONE (1) CAPSULE BY MOUTH (3) TIMES DAILY    ? glipiZIDE (GLUCOTROL XL) 10 MG 24 hr tablet Take 10 mg by mouth daily with breakfast.    ? HYDROcodone-acetaminophen (NORCO) 10-325 MG per tablet Take 1 tablet by mouth every 6 (six) hours as needed. 75 tablet 0  ? levETIRAcetam (KEPPRA) 1000 MG tablet Take 1,000 mg by mouth daily at 6 (six) AM.    ? METFORMIN HCL ER PO Take 1,000 mg by mouth in the morning and at bedtime.    ?  metoprolol (LOPRESSOR) 100 MG tablet Take 100 mg by mouth 2 (two) times daily.    ? pioglitazone (ACTOS) 30 MG tablet Take 30 mg by mouth daily.    ? ?No current facility-administered medications for this visit.  ? ? ?Allergies as of 04/12/2021 - Review Complete 04/12/2021  ?Allergen Reaction Noted  ? Penicillins Rash 01/01/2021  ? ? ?Family History  ?Problem Relation Age of Onset  ? Colon cancer Daughter   ? ? ?Social History  ? ?Socioeconomic History  ? Marital status: Married  ?  Spouse name: Not on file  ? Number of children: Not on file  ? Years of education: Not on file  ? Highest education level: Not on file  ?Occupational History  ? Not on file  ?Tobacco Use  ? Smoking status: Never  ?  Passive exposure: Past  ? Smokeless  tobacco: Never  ?Vaping Use  ? Vaping Use: Not on file  ?Substance and Sexual Activity  ? Alcohol use: No  ? Drug use: No  ? Sexual activity: Not Currently  ?  Birth control/protection: Surgical  ?Other Topics Concern  ? Not on file  ?Social History Narrative  ? Not on file  ? ?Social Determinants of Health  ? ?Financial Resource Strain: Not on file  ?Food Insecurity: Not on file  ?Transportation Needs: Not on file  ?Physical Activity: Not on file  ?Stress: Not on file  ?Social Connections: Not on file  ? ?Review of systems ?General: negative for malaise, night sweats, fever, chills, weight loss ?Neck: Negative for lumps, goiter, pain and significant neck swelling ?Resp: Negative for cough, wheezing, dyspnea at rest ?CV: Negative for chest pain, leg swelling, palpitations, orthopnea ?GI: denies melena, hematochezia, nausea, vomiting, diarrhea, constipation, dysphagia, odyonophagia, early satiety or unintentional weight loss.  ?MSK: Negative for joint pain or swelling, back pain, and muscle pain. ?Derm: Negative for itching or rash ?Psych: Denies depression, anxiety, memory loss, confusion. No homicidal or suicidal ideation.  ?Heme: Negative for prolonged bleeding, bruising easily, and  swollen nodes. ?Endocrine: Negative for cold or heat intolerance, polyuria, polydipsia and goiter. ?Neuro: negative for tremor, gait imbalance, syncope and seizures. ?The remainder of the review of systems is nonc

## 2021-04-12 NOTE — Patient Instructions (Signed)
Please continue on colestipol 2g twice a day ?Please let me know if you have any new or worsening GI symptoms ?We will plan to repeat colonoscopy again in December 2025 ? ?Follow up 1 year ?

## 2021-04-22 ENCOUNTER — Other Ambulatory Visit (INDEPENDENT_AMBULATORY_CARE_PROVIDER_SITE_OTHER): Payer: Self-pay

## 2021-04-22 DIAGNOSIS — R197 Diarrhea, unspecified: Secondary | ICD-10-CM

## 2021-04-22 MED ORDER — COLESTIPOL HCL 1 G PO TABS: 2.0000 g | ORAL_TABLET | Freq: Two times a day (BID) | ORAL | 2 refills | Status: DC

## 2021-06-28 ENCOUNTER — Other Ambulatory Visit (INDEPENDENT_AMBULATORY_CARE_PROVIDER_SITE_OTHER): Payer: Self-pay | Admitting: *Deleted

## 2021-06-28 DIAGNOSIS — R197 Diarrhea, unspecified: Secondary | ICD-10-CM

## 2021-06-28 MED ORDER — COLESTIPOL HCL 1 G PO TABS: 2.0000 g | ORAL_TABLET | Freq: Two times a day (BID) | ORAL | 2 refills | Status: DC

## 2021-09-04 ENCOUNTER — Other Ambulatory Visit (INDEPENDENT_AMBULATORY_CARE_PROVIDER_SITE_OTHER): Payer: Self-pay | Admitting: Gastroenterology

## 2021-09-04 DIAGNOSIS — R197 Diarrhea, unspecified: Secondary | ICD-10-CM

## 2021-09-06 ENCOUNTER — Other Ambulatory Visit (INDEPENDENT_AMBULATORY_CARE_PROVIDER_SITE_OTHER): Payer: Self-pay

## 2021-09-06 NOTE — Telephone Encounter (Signed)
Seen 3/23

## 2021-09-25 ENCOUNTER — Other Ambulatory Visit (INDEPENDENT_AMBULATORY_CARE_PROVIDER_SITE_OTHER): Payer: Self-pay | Admitting: Gastroenterology

## 2021-09-25 DIAGNOSIS — R197 Diarrhea, unspecified: Secondary | ICD-10-CM

## 2021-09-28 NOTE — Telephone Encounter (Signed)
Last seen by you on 04/12/21 for diarrhea, constipation. Next appt 04/14/22

## 2021-09-29 ENCOUNTER — Other Ambulatory Visit (INDEPENDENT_AMBULATORY_CARE_PROVIDER_SITE_OTHER): Payer: Self-pay | Admitting: Gastroenterology

## 2021-09-29 DIAGNOSIS — R197 Diarrhea, unspecified: Secondary | ICD-10-CM

## 2021-10-18 ENCOUNTER — Other Ambulatory Visit (INDEPENDENT_AMBULATORY_CARE_PROVIDER_SITE_OTHER): Payer: Self-pay | Admitting: Gastroenterology

## 2021-10-18 DIAGNOSIS — R197 Diarrhea, unspecified: Secondary | ICD-10-CM

## 2021-11-04 ENCOUNTER — Other Ambulatory Visit (INDEPENDENT_AMBULATORY_CARE_PROVIDER_SITE_OTHER): Payer: Self-pay | Admitting: Gastroenterology

## 2021-11-04 DIAGNOSIS — R197 Diarrhea, unspecified: Secondary | ICD-10-CM

## 2021-11-04 NOTE — Telephone Encounter (Signed)
Last OV 04/12/21. Has upcoming appt march 2024

## 2021-11-21 ENCOUNTER — Other Ambulatory Visit (INDEPENDENT_AMBULATORY_CARE_PROVIDER_SITE_OTHER): Payer: Self-pay | Admitting: Gastroenterology

## 2021-11-21 DIAGNOSIS — R197 Diarrhea, unspecified: Secondary | ICD-10-CM

## 2021-12-07 ENCOUNTER — Other Ambulatory Visit (INDEPENDENT_AMBULATORY_CARE_PROVIDER_SITE_OTHER): Payer: Self-pay | Admitting: Gastroenterology

## 2021-12-07 DIAGNOSIS — R197 Diarrhea, unspecified: Secondary | ICD-10-CM

## 2021-12-23 ENCOUNTER — Other Ambulatory Visit (INDEPENDENT_AMBULATORY_CARE_PROVIDER_SITE_OTHER): Payer: Self-pay | Admitting: Gastroenterology

## 2021-12-23 DIAGNOSIS — R197 Diarrhea, unspecified: Secondary | ICD-10-CM

## 2022-01-10 ENCOUNTER — Other Ambulatory Visit (INDEPENDENT_AMBULATORY_CARE_PROVIDER_SITE_OTHER): Payer: Self-pay | Admitting: Gastroenterology

## 2022-01-10 DIAGNOSIS — R197 Diarrhea, unspecified: Secondary | ICD-10-CM

## 2022-01-27 ENCOUNTER — Other Ambulatory Visit (INDEPENDENT_AMBULATORY_CARE_PROVIDER_SITE_OTHER): Payer: Self-pay | Admitting: Gastroenterology

## 2022-01-27 DIAGNOSIS — R197 Diarrhea, unspecified: Secondary | ICD-10-CM

## 2022-02-14 ENCOUNTER — Other Ambulatory Visit (INDEPENDENT_AMBULATORY_CARE_PROVIDER_SITE_OTHER): Payer: Self-pay | Admitting: Gastroenterology

## 2022-02-14 DIAGNOSIS — R197 Diarrhea, unspecified: Secondary | ICD-10-CM

## 2022-02-14 NOTE — Telephone Encounter (Signed)
Last seen 04/12/21 and has appt 04/14/22

## 2022-03-07 ENCOUNTER — Other Ambulatory Visit (INDEPENDENT_AMBULATORY_CARE_PROVIDER_SITE_OTHER): Payer: Self-pay | Admitting: Gastroenterology

## 2022-03-07 DIAGNOSIS — R197 Diarrhea, unspecified: Secondary | ICD-10-CM

## 2022-03-07 NOTE — Telephone Encounter (Signed)
Last seen 04/12/21 and has a follow up on 04/14/22

## 2022-04-14 ENCOUNTER — Ambulatory Visit (INDEPENDENT_AMBULATORY_CARE_PROVIDER_SITE_OTHER): Payer: Medicare Other | Admitting: Gastroenterology

## 2022-06-14 ENCOUNTER — Other Ambulatory Visit (INDEPENDENT_AMBULATORY_CARE_PROVIDER_SITE_OTHER): Payer: Self-pay | Admitting: Gastroenterology

## 2022-06-14 DIAGNOSIS — R197 Diarrhea, unspecified: Secondary | ICD-10-CM

## 2022-11-16 ENCOUNTER — Other Ambulatory Visit (INDEPENDENT_AMBULATORY_CARE_PROVIDER_SITE_OTHER): Payer: Self-pay | Admitting: Gastroenterology

## 2022-11-16 DIAGNOSIS — R197 Diarrhea, unspecified: Secondary | ICD-10-CM

## 2022-11-21 ENCOUNTER — Other Ambulatory Visit (INDEPENDENT_AMBULATORY_CARE_PROVIDER_SITE_OTHER): Payer: Self-pay | Admitting: Gastroenterology

## 2022-11-21 ENCOUNTER — Telehealth (INDEPENDENT_AMBULATORY_CARE_PROVIDER_SITE_OTHER): Payer: Self-pay | Admitting: *Deleted

## 2022-11-21 DIAGNOSIS — R197 Diarrhea, unspecified: Secondary | ICD-10-CM

## 2022-11-21 MED ORDER — COLESTIPOL HCL 1 G PO TABS: 2.0000 g | ORAL_TABLET | Freq: Two times a day (BID) | ORAL | 0 refills | Status: DC

## 2022-11-21 NOTE — Telephone Encounter (Signed)
Pt asking for refill on colestipol. [facility] [place]. She has not been seen in over one year. Last visit march 2023. Pt advised she needs office visit every year to receive prescription med. Mitzie can you schedule her since she is out of med and chelsea she is asking if she can have one refill since she out.   [facility] [place]   [phone]

## 2022-11-21 NOTE — Telephone Encounter (Signed)
Pt notified one refill sent

## 2022-12-29 ENCOUNTER — Encounter (INDEPENDENT_AMBULATORY_CARE_PROVIDER_SITE_OTHER): Payer: Self-pay | Admitting: Gastroenterology

## 2022-12-29 ENCOUNTER — Ambulatory Visit (INDEPENDENT_AMBULATORY_CARE_PROVIDER_SITE_OTHER): Payer: Medicare Other | Admitting: Gastroenterology

## 2022-12-29 VITALS — BP 178/82 | HR 80 | Temp 98.8°F | Ht 61.0 in | Wt 140.8 lb

## 2022-12-29 DIAGNOSIS — K8689 Other specified diseases of pancreas: Secondary | ICD-10-CM | POA: Diagnosis not present

## 2022-12-29 DIAGNOSIS — R634 Abnormal weight loss: Secondary | ICD-10-CM | POA: Diagnosis not present

## 2022-12-29 DIAGNOSIS — K9089 Other intestinal malabsorption: Secondary | ICD-10-CM | POA: Insufficient documentation

## 2022-12-29 NOTE — Progress Notes (Addendum)
Referring Provider: Glenis Smoker, MD Primary Care Physician:  Glenis Smoker, MD Primary GI Physician: Dr. Levon Hedger   Chief Complaint  Patient presents with   Diarrhea    Follow up on diarrhea. States she is no longer having diarrhea.   Weight Loss    Has concerns about weight loss. States she eats well but is losing weight.    Hemorrhoids    Would like to discuss hemorrhoids. They itch at times. Nothing using any treatment currently.    HPI:   Michelle Crane is a 67 y.o. female with past medical history of anxiety, GERD, HLD, HTN, DM, seizure disorder, neuropathy.   Patient presenting today for follow up of diarrhea, weight loss  Last seen march 2023, at that time she states that colestipol 2 g twice daily is working well for her, having 2 BMs per day without constipation or diarrhea.  Recommend she continue colestipol 2 g twice daily, repeat colonoscopy December 2025.  Present: States doing well on colestipol 2g BID. She has a BM daily, no constipation or diarrhea.   She notes that she has lost quite a bit of weight, was 166lbs in march of 2023, 140 lbs today. She had a change in BP medication to losartan and changed from xanax to clonazepam but no other medication changes. She notes that she eats 3 meals a day and does not snack in between, does watch what she eats due to her diabetes. She does not feel that she has had any changes in appetite. Reports she had labs done about a month ago with PCP but is unsure what was checked. No nausea or vomiting. Denies rectal bleeding or melena. No abdominal pain. She notes that she weighs herself at home and has been weighing around 140lbs. She does note that around the beginning of last year she got pneumonia and was hospitalized then had to go to rehab thereafter. Appears weight loss began after this.    Last Colonoscopy:01/01/21- Hemorrhoids found on perianal exam. - Three 3 to 6 mm polyps in the transverse colon and in the  ascending colon-all tubular adenomas - One 3 mm polyp in the transverse colon-tubular adenoma - One 3 mm polyp in the descending colon-tubular adenoma - The rest of the examined colon is normal. Biopsied-normal biopsies - The distal rectum and anal verge are normal on retroflexion view. Last Endoscopy:never   Recommendations:  Repeat in 3 years  Last Endoscopy:  Recommendations:    Past Medical History:  Diagnosis Date   Anxiety    takes Xanax prn   Carpal tunnel syndrome    Carpal tunnel syndrome    Chronic back pain    Chronic back pain    spondylolisthesis/stenosis/radiculopathy/hnp   Complication of anesthesia    DDD (degenerative disc disease)    Depression    Diabetes mellitus    metformin, sugars run 200's   GERD (gastroesophageal reflux disease)    Headache(784.0)    occasionally   History of bladder infections    Hyperlipidemia    doesn't take any meds for this;tries to control with diet   Hypertension    takes Lopressor daily   Insomnia    takes trazodone nightly   Joint pain    Joint swelling    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)    Nocturia    PONV (postoperative nausea and vomiting)    Primary localized osteoarthritis of left knee 02/04/2014   Right rotator cuff tear, massive, three  tendon 07/18/2014   Seizure disorder ([facility])    Shortness of breath    with exertion   Type 2 diabetes mellitus ([facility])    Umbilical hernia    pt states that it doesn't bother her   Urinary frequency    Yeast infection    history of;uses myccostatin cream prn    Past Surgical History:  Procedure Laterality Date   ABDOMINAL HYSTERECTOMY  12yrs ago   BACK SURGERY  [id number],[id number]   BIOPSY  01/01/2021   Procedure: BIOPSY;  Surgeon: Dolores Frame, MD;  [facility];  Service: Gastroenterology;;   Fidela Salisbury RELEASE  05/09/2011   Procedure: CARPAL TUNNEL RELEASE;  Surgeon: Tami Ribas, MD;  Location: [facility];   Service: Orthopedics;  Laterality: Right;   CHOLECYSTECTOMY     COLONOSCOPY WITH PROPOFOL N/A 01/01/2021   Procedure: COLONOSCOPY WITH PROPOFOL;  Surgeon: Dolores Frame, MD;  [facility];  Service: Gastroenterology;  Laterality: N/A;  8:15   JOINT REPLACEMENT  2011, 2016   rt total knee, lt TKR   POLYPECTOMY  01/01/2021   Procedure: POLYPECTOMY;  Surgeon: Dolores Frame, MD;  [facility];  Service: Gastroenterology;;   POSTERIOR LUMBAR FUSION 4 LEVEL  12/27/2011   Procedure: POSTERIOR LUMBAR FUSION 4 LEVEL;  Surgeon: Maeola Harman, MD;  Location: [facility];  Service: Neurosurgery;  Laterality: Bilateral;  Lumbar two-three, three-four, four-five, lumbar five sacral one Redo decompression/Fusion   RESECTION DISTAL CLAVICAL Right 07/18/2014   Procedure: RESECTION DISTAL CLAVICAL;  Surgeon: Teryl Lucy, MD;  Location: [facility];  Service: Orthopedics;  Laterality: Right;   SHOULDER ARTHROSCOPY WITH ROTATOR CUFF REPAIR AND SUBACROMIAL DECOMPRESSION Right 07/18/2014   Procedure: RIGHT SHOULDER SCOPE SUBACROMIAL DECOMPRESSION/ROTATOR CUFF REPAIR ;  Surgeon: Teryl Lucy, MD;  Location: [facility];  Service: Orthopedics;  Laterality: Right;  ANESTHESIA:  GENERAL, PRE/POST OP SCALENE   TONSILLECTOMY  at age 70   TOTAL KNEE ARTHROPLASTY Left 02/04/2014   Procedure: LEFT TOTAL KNEE ARTHROPLASTY;  Surgeon: Eulas Post, MD;  Location: [facility];  Service: Orthopedics;  Laterality: Left;    Current Outpatient Medications  Medication Sig Dispense Refill   atorvastatin (LIPITOR) 40 MG tablet Take 40 mg by mouth daily.     clonazePAM (KLONOPIN) 1 MG tablet Take 0.5 mg by mouth 2 (two) times daily as needed.     gabapentin (NEURONTIN) 300 MG capsule TAKE ONE (1) CAPSULE BY MOUTH (3) TIMES DAILY     glipiZIDE (GLUCOTROL XL) 10 MG 24 hr tablet Take 10 mg by mouth daily with breakfast.     HYDROcodone-acetaminophen (NORCO) 10-325 MG per  tablet Take 1 tablet by mouth every 6 (six) hours as needed. 75 tablet 0   losartan (COZAAR) 100 MG tablet Take 100 mg by mouth daily.     METFORMIN HCL ER PO Take 1,000 mg by mouth in the morning and at bedtime.     colestipol (COLESTID) 1 g tablet Take 2 tablets (2 g total) by mouth 2 (two) times daily. (Patient not taking: Reported on 12/29/2022) 120 tablet 0   levETIRAcetam (KEPPRA) 1000 MG tablet Take 1,000 mg by mouth daily at 6 (six) AM. (Patient not taking: Reported on 12/29/2022)     metoprolol (LOPRESSOR) 100 MG tablet Take 100 mg by mouth 2 (two) times daily. (Patient not taking: Reported on 12/29/2022)     No current facility-administered medications for this visit.    Allergies as  of 12/29/2022 - Review Complete 12/29/2022  Allergen Reaction Noted   Penicillins Rash 01/01/2021    Family History  Problem Relation Age of Onset   Colon cancer Daughter     Social History   Socioeconomic History   Marital status: Married    Spouse name: Not on file   Number of children: Not on file   Years of education: Not on file   Highest education level: Not on file  Occupational History   Not on file  Tobacco Use   Smoking status: Never    Passive exposure: Past   Smokeless tobacco: Never  Vaping Use   Vaping status: Not on file  Substance and Sexual Activity   Alcohol use: No   Drug use: No   Sexual activity: Not Currently    Birth control/protection: Surgical  Other Topics Concern   Not on file  Social History Narrative   Not on file   Social Determinants of Health   Financial Resource Strain: Not on file  Food Insecurity: Not on file  Transportation Needs: Not on file  Physical Activity: Not on file  Stress: Not on file  Social Connections: Not on file    Review of systems General: negative for malaise, night sweats, fever, chills, +weight loss  Neck: Negative for lumps, goiter, pain and significant neck swelling Resp: Negative for cough, wheezing, dyspnea at  rest CV: Negative for chest pain, leg swelling, palpitations, orthopnea GI: denies melena, hematochezia, nausea, vomiting, diarrhea, constipation, dysphagia, odyonophagia, early satiety +weight loss  MSK: Negative for joint pain or swelling, back pain, and muscle pain. Derm: Negative for itching or rash Psych: Denies depression, anxiety, memory loss, confusion. No homicidal or suicidal ideation.  Heme: Negative for prolonged bleeding, bruising easily, and swollen nodes. Endocrine: Negative for cold or heat intolerance, polyuria, polydipsia and goiter. Neuro: negative for tremor, gait imbalance, syncope and seizures. The remainder of the review of systems is noncontributory.  Physical Exam: BP (!) 178/82   Pulse 80   Temp 98.8 F (37.1 C) (Oral)   Ht 5\' 1"  (1.549 m)   Wt 140 lb 12.8 oz (63.9 kg)   BMI 26.60 kg/m  General:   Alert and oriented. No distress noted. Pleasant and cooperative.  Head:  Normocephalic and atraumatic. Eyes:  Conjuctiva clear without scleral icterus. Mouth:  Oral mucosa pink and moist. Good dentition. No lesions. Heart: Normal rate and rhythm, s1 and s2 heart sounds present.  Lungs: Clear lung sounds in all lobes. Respirations equal and unlabored. Abdomen:  +BS, soft, non-tender and non-distended. No rebound or guarding. No HSM or masses noted. Derm: No palmar erythema or jaundice Msk:  Symmetrical without gross deformities. Normal posture. Extremities:  Without edema. Neurologic:  Alert and  oriented x4 Psych:  Alert and cooperative. Normal mood and affect.  Invalid input(s): "6 MONTHS"   ASSESSMENT: TOMMA SPIVA is a 67 y.o. 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 She has responded well to colestipol 2g BID, may some aspect of EPI given previous CT imaging shows pancreatic atrophy, however, as she is doing well and had resolution of her symptoms, will continue with coletipol 2g  BID for now.  Weight loss: down approximately 14 pounds over the past 18 months. She reports appetite is good. Did notably have hospitalization and stent in rehab facility in mid 2023 which may have contributed. Per her report she has maintained around the 140lb  mark over the last few months when weighing at home. She denies any GI symptoms. Had recent labs with PCP though unclear what was drawn at that time. Will obtain these for review, would be important to check thyroid function if this was not already done. If thyroid function is normal and she continues to lose weight, would have low threshold to repeat Colonoscopy.    PLAN:  Obtain labs from PCP, check TSH if not done  2. Continue with colestipol 2g BID  3. May proceed with sooner EGD and colonoscopy if weight continues to decline   All questions were answered, patient verbalized understanding and is in agreement with plan as outlined above.    Follow Up: 8 weeks   Caitlyne Ingham L. Jeanmarie Hubert, MSN, APRN, AGNP-C Adult-Gerontology Nurse Practitioner [facility]  I have reviewed the note and agree with the APP's assessment as described in this progress note  Katrinka Blazing, MD Gastroenterology and Hepatology [facility]

## 2022-12-29 NOTE — Patient Instructions (Signed)
Please continue with colestipol 2g twice daily I will obtain labs from your PCP, if thyroid function was not checked, I will reach out to you to have you get thyroid testing done  We will plan for follow up in about 8 weeks to check in on how your weight is looking, if you are continuing to go down in weight and thyroid function is normal, we may need to repeat a colonoscopy sooner than late next year  It was a pleasure to see you today. I want to create trusting relationships with patients and provide genuine, compassionate, and quality care. I truly value your feedback! please be on the lookout for a survey regarding your visit with me today. I appreciate your input about our visit and your time in completing this!    Aidan Moten L. Jeanmarie Hubert, MSN, APRN, AGNP-C Adult-Gerontology Nurse Practitioner [facility]

## 2023-01-13 ENCOUNTER — Encounter (INDEPENDENT_AMBULATORY_CARE_PROVIDER_SITE_OTHER): Payer: Self-pay | Admitting: Gastroenterology

## 2023-01-24 ENCOUNTER — Telehealth (INDEPENDENT_AMBULATORY_CARE_PROVIDER_SITE_OTHER): Payer: Self-pay | Admitting: *Deleted

## 2023-01-24 ENCOUNTER — Other Ambulatory Visit (INDEPENDENT_AMBULATORY_CARE_PROVIDER_SITE_OTHER): Payer: Self-pay | Admitting: *Deleted

## 2023-01-24 DIAGNOSIS — R197 Diarrhea, unspecified: Secondary | ICD-10-CM

## 2023-01-24 NOTE — Telephone Encounter (Signed)
Patient called for refill on colestipol. Last seen by chelsea on 12/29/22. Note states to continue colestipol 2 g bid.   [facility] [place].   Pt's number [phone]

## 2023-01-25 ENCOUNTER — Other Ambulatory Visit: Payer: Self-pay | Admitting: Gastroenterology

## 2023-01-25 DIAGNOSIS — R197 Diarrhea, unspecified: Secondary | ICD-10-CM

## 2023-01-25 MED ORDER — COLESTIPOL HCL 1 G PO TABS
2.0000 g | ORAL_TABLET | Freq: Two times a day (BID) | ORAL | 3 refills | Status: DC
Start: 2023-01-25 — End: 2023-09-26

## 2023-01-25 NOTE — Telephone Encounter (Signed)
 Rx sent

## 2023-01-26 NOTE — Telephone Encounter (Signed)
 Pt.notified

## 2023-02-27 ENCOUNTER — Ambulatory Visit (INDEPENDENT_AMBULATORY_CARE_PROVIDER_SITE_OTHER): Payer: Medicare Other | Admitting: Gastroenterology

## 2023-03-09 ENCOUNTER — Ambulatory Visit (INDEPENDENT_AMBULATORY_CARE_PROVIDER_SITE_OTHER): Payer: Medicare Other | Admitting: Gastroenterology

## 2023-03-23 ENCOUNTER — Ambulatory Visit (INDEPENDENT_AMBULATORY_CARE_PROVIDER_SITE_OTHER): Payer: Medicare Other | Admitting: Gastroenterology

## 2023-05-25 ENCOUNTER — Ambulatory Visit (INDEPENDENT_AMBULATORY_CARE_PROVIDER_SITE_OTHER): Payer: Medicare Other | Admitting: Gastroenterology

## 2023-05-25 ENCOUNTER — Encounter (INDEPENDENT_AMBULATORY_CARE_PROVIDER_SITE_OTHER): Payer: Self-pay | Admitting: Gastroenterology

## 2023-05-25 VITALS — BP 150/82 | HR 90 | Temp 97.5°F | Ht 61.0 in | Wt 137.7 lb

## 2023-05-25 DIAGNOSIS — R634 Abnormal weight loss: Secondary | ICD-10-CM

## 2023-05-25 DIAGNOSIS — R143 Flatulence: Secondary | ICD-10-CM | POA: Diagnosis not present

## 2023-05-25 DIAGNOSIS — K8689 Other specified diseases of pancreas: Secondary | ICD-10-CM | POA: Diagnosis not present

## 2023-05-25 DIAGNOSIS — Z860101 Personal history of adenomatous and serrated colon polyps: Secondary | ICD-10-CM

## 2023-05-25 DIAGNOSIS — R197 Diarrhea, unspecified: Secondary | ICD-10-CM

## 2023-05-25 NOTE — Patient Instructions (Signed)
 Continue colestipol  2g twice daily for diarrhea You can try over the counter gas x or phazyme for gas You can also try an over the counter probiotic which may help with the gas as well If symptoms are not improving, please let me know We will plan for colonoscopy in December, they will reach you closer to time to schedule this  Follow up 1 year

## 2023-05-25 NOTE — Progress Notes (Signed)
 Referring Provider: Helayne Lo, MD Primary Care Physician:  Helayne Lo, MD Primary GI Physician: Dr. Sammi Crick   Chief Complaint  Patient presents with   bile salt induced diarrhea    Follow up on bile salt induced diarrhea. States medication is working well and no concerns today.    HPI:   Michelle Crane is a 68 y.o. female with past medical history of anxiety, GERD, HLD, HTN, DM, seizure disorder, neuropathy.   Patient presenting today for:  Follow up of diarrhea, weight loss and complaints of more flatus   Last seen December 2024, at that time doing well on colestipol  2g BID, no constipation or diarrhea. Some weight loss. Eating well but watching what she eats, no snacking, had fairly recent hospital admission and weight loss began after this.  Recommended to continue with colestipol  2g BID, obtain labs from PCP, check TSH if not done, consider egd/colonoscopy sooner if weight continues to decline  Present:  Doing well on colestipol  2g BID. She is having a BM once daily with solid stools. No abdominal pain. Appetite is good. No nausea or vomiting. She feels that her clothes are too big. Previously weighed about 170 pounds. No nausea or vomiting, denies rectal bleeding or melena. Feels that she eats good. Denies any early satiety or difficulty finishing her meals.  She notes more gas with most anything she eats. She has had this issue for some time. She has not taken anything for it so far.    Has upcoming labs with PCP in June   CT A/P with contrast: 2022 showed fatty atrophy of the pancreas Last Colonoscopy:01/01/21- Hemorrhoids found on perianal exam. - Three 3 to 6 mm polyps in the transverse colon and in the ascending colon-all tubular adenomas - One 3 mm polyp in the transverse colon-tubular adenoma - One 3 mm polyp in the descending colon-tubular adenoma - The rest of the examined colon is normal. Biopsied-normal biopsies - The distal rectum and anal verge  are normal on retroflexion view. Last Endoscopy:never  Repeat Colonoscopy December 2025  Filed Weights   05/25/23 1336  Weight: 137 lb 11.2 oz (62.5 kg)     Past Medical History:  Diagnosis Date   Anxiety    takes Xanax  prn   Carpal tunnel syndrome    Carpal tunnel syndrome    Chronic back pain    Chronic back pain    spondylolisthesis/stenosis/radiculopathy/hnp   Complication of anesthesia    DDD (degenerative disc disease)    Depression    Diabetes mellitus    metformin, sugars run 200's   GERD (gastroesophageal reflux disease)    Headache(784.0)    occasionally   History of bladder infections    Hyperlipidemia    doesn't take any meds for this;tries to control with diet   Hypertension    takes Lopressor  daily   Insomnia    takes trazodone  nightly   Joint pain    Joint swelling    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)    Nocturia    PONV (postoperative nausea and vomiting)    Primary localized osteoarthritis of left knee 02/04/2014   Right rotator cuff tear, massive, three tendon 07/18/2014   Seizure disorder (HCC)    Shortness of breath    with exertion   Type 2 diabetes mellitus (HCC)    Umbilical hernia    pt states that it doesn't bother her   Urinary frequency    Yeast infection  history of;uses myccostatin cream prn    Past Surgical History:  Procedure Laterality Date   ABDOMINAL HYSTERECTOMY  45yrs ago   BACK SURGERY  [id number]   BIOPSY  01/01/2021   Procedure: BIOPSY;  Surgeon: Urban Garden, MD;  Location: [facility];  Service: Gastroenterology;;   CARPAL TUNNEL RELEASE  05/09/2011   Procedure: CARPAL TUNNEL RELEASE;  Surgeon: Milagros Alf, MD;  Location: [facility];  Service: Orthopedics;  Laterality: Right;   CHOLECYSTECTOMY     COLONOSCOPY WITH PROPOFOL  N/A 01/01/2021   Procedure: COLONOSCOPY WITH PROPOFOL ;  Surgeon: Urban Garden, MD;  Location: [facility];  Service:  Gastroenterology;  Laterality: N/A;  8:15   JOINT REPLACEMENT  2011, 2016   rt total knee, lt TKR   POLYPECTOMY  01/01/2021   Procedure: POLYPECTOMY;  Surgeon: Urban Garden, MD;  Location: [facility];  Service: Gastroenterology;;   POSTERIOR LUMBAR FUSION 4 LEVEL  12/27/2011   Procedure: POSTERIOR LUMBAR FUSION 4 LEVEL;  Surgeon: Manya Sells, MD;  Location: [facility];  Service: Neurosurgery;  Laterality: Bilateral;  Lumbar two-three, three-four, four-five, lumbar five sacral one Redo decompression/Fusion   RESECTION DISTAL CLAVICAL Right 07/18/2014   Procedure: RESECTION DISTAL CLAVICAL;  Surgeon: Osa Blase, MD;  Location: [facility];  Service: Orthopedics;  Laterality: Right;   SHOULDER ARTHROSCOPY WITH ROTATOR CUFF REPAIR AND SUBACROMIAL DECOMPRESSION Right 07/18/2014   Procedure: RIGHT SHOULDER SCOPE SUBACROMIAL DECOMPRESSION/ROTATOR CUFF REPAIR ;  Surgeon: Osa Blase, MD;  Location: [facility];  Service: Orthopedics;  Laterality: Right;  ANESTHESIA:  GENERAL, PRE/POST OP SCALENE   TONSILLECTOMY  at age 43   TOTAL KNEE ARTHROPLASTY Left 02/04/2014   Procedure: LEFT TOTAL KNEE ARTHROPLASTY;  Surgeon: Neville Barbone, MD;  Location: [facility];  Service: Orthopedics;  Laterality: Left;    Current Outpatient Medications  Medication Sig Dispense Refill   atorvastatin (LIPITOR) 40 MG tablet Take 40 mg by mouth daily.     clonazePAM (KLONOPIN) 1 MG tablet Take 0.5 mg by mouth 2 (two) times daily as needed.     colestipol  (COLESTID ) 1 g tablet Take 2 tablets (2 g total) by mouth 2 (two) times daily. 120 tablet 3   gabapentin (NEURONTIN) 300 MG capsule TAKE ONE (1) CAPSULE BY MOUTH (3) TIMES DAILY     glipiZIDE (GLUCOTROL XL) 10 MG 24 hr tablet Take 10 mg by mouth daily with breakfast.     HYDROcodone -acetaminophen  (NORCO) 10-325 MG per tablet Take 1 tablet by mouth every 6 (six) hours as needed. 75 tablet 0   losartan (COZAAR) 100 MG tablet Take 100  mg by mouth daily.     METFORMIN HCL ER PO Take 1,000 mg by mouth in the morning and at bedtime.     No current facility-administered medications for this visit.    Allergies as of 05/25/2023 - Review Complete 05/25/2023  Allergen Reaction Noted   Penicillins Rash 01/01/2021    Social History   Socioeconomic History   Marital status: Married    Spouse name: Not on file   Number of children: Not on file   Years of education: Not on file   Highest education level: Not on file  Occupational History   Not on file  Tobacco Use   Smoking status: Never    Passive exposure: Past   Smokeless tobacco: Never  Vaping Use   Vaping status: Not on file  Substance and Sexual Activity   Alcohol  use:  No   Drug use: No   Sexual activity: Not Currently    Birth control/protection: Surgical  Other Topics Concern   Not on file  Social History Narrative   Not on file   Social Drivers of Health   Financial Resource Strain: Not on file  Food Insecurity: Not on file  Transportation Needs: Not on file  Physical Activity: Not on file  Stress: Not on file  Social Connections: Not on file    Review of systems General: negative for malaise, night sweats, fever, chills +weight loss  Neck: Negative for lumps, goiter, pain and significant neck swelling Resp: Negative for cough, wheezing, dyspnea at rest CV: Negative for chest pain, leg swelling, palpitations, orthopnea GI: denies melena, hematochezia, nausea, vomiting, diarrhea, constipation, dysphagia, odyonophagia, early satiety +weight loss +flatus  The remainder of the review of systems is noncontributory.  Physical Exam: BP (!) 150/82   Pulse 90   Temp (!) 97.5 F (36.4 C)   Ht 5\' 1"  (1.549 m)   Wt 137 lb 11.2 oz (62.5 kg)   BMI 26.02 kg/m  General:   Alert and oriented. No distress noted. Pleasant and cooperative.  Head:  Normocephalic and atraumatic. Eyes:  Conjuctiva clear without scleral icterus. Mouth:  Oral mucosa pink and  moist. Good dentition. No lesions. Heart: Normal rate and rhythm, s1 and s2 heart sounds present.  Lungs: Clear lung sounds in all lobes. Respirations equal and unlabored. Abdomen:  +BS, soft, non-tender and non-distended. No rebound or guarding. No HSM or masses noted. Neurologic:  Alert and  oriented x4 Psych:  Alert and cooperative. Normal mood and affect.  Invalid input(s): "6 MONTHS"   ASSESSMENT: Michelle Crane is a 68 y.o. female presenting today for follow up of diarrhea, weight loss and with c/o of flatus  Diarrhea: well managed on colestipol  2g BID, having 1 solid stool per day without abdominal pain. Notably, she had evidence of pancreatic atrophy on cross sectional imaging in 2022, therefore may have a component of EPI as well, though as she is doing well at this time, will continue with colestipol  2g BID.  Weight loss: mostly stable since last visit in December when she was 140lbs, 137 today. Notably she had decline in weight after hospital admission early last year. Has upcoming labs/physical with her PCP. 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 Not taking anything for this. Would recommend starting daily probiotic and trying otc phazyme or gas x as gas mostly likely related to dietary intake, though if this fails to improve with the above interventions, could consider further evaluation with SIBO breath testing.   She will be due for screening colonoscopy in December 2025 due to history of tubular adenomas, patient on recall list to be scheduled for this closer to due time.   PLAN:  -continue with colestipol  2g BID -can take otc phazyme or gas x -start daily  probiotic -colonoscopy in December 2025  All questions were answered, patient verbalized understanding and is in agreement with plan as outlined above.   Follow Up: 1 year   Takira Sherrin L. Arika Mainer, MSN, APRN, AGNP-C Adult-Gerontology Nurse Practitioner [facility]  I have reviewed the  note and agree with the APP's assessment as described in this progress note   Samantha Cress, MD Gastroenterology and Hepatology [facility] Gastroenterology

## 2023-09-22 ENCOUNTER — Other Ambulatory Visit: Payer: Self-pay | Admitting: Gastroenterology

## 2023-09-22 DIAGNOSIS — R197 Diarrhea, unspecified: Secondary | ICD-10-CM

## 2023-12-08 ENCOUNTER — Encounter (INDEPENDENT_AMBULATORY_CARE_PROVIDER_SITE_OTHER): Payer: Self-pay | Admitting: *Deleted

## 2024-01-16 ENCOUNTER — Telehealth: Payer: Self-pay | Admitting: *Deleted

## 2024-01-16 NOTE — Telephone Encounter (Signed)
 Who is your primary care physician: Charmaine Jayne [facility]  Reasons for the colonoscopy: surveillance  Have you had a colonoscopy before?  12/2020, Dr. Eartha  Do you have family history of colon cancer? no  Previous colonoscopy with polyps removed? yes  Do you have a history colorectal cancer?   no  Are you diabetic? If yes, Type 1 or Type 2?    yes  Do you have a prosthetic or mechanical heart valve? no  Do you have a pacemaker/defibrillator?   no  Have you had endocarditis/atrial fibrillation? no  Have you had joint replacement within the last 12 months?  no  Do you tend to be constipated or have to use laxatives? no  Do you have any history of drugs or alcohol ?  no  Do you use supplemental oxygen?  no  Have you had a stroke or heart attack within the last 6 months? no  Do you take weight loss medication?  no  For female patients: have you had a hysterectomy?  yes                                     are you post menopausal?                                                   do you still have your menstrual cycle?       Do you take any blood-thinning medications such as: (aspirin, warfarin, Plavix, Aggrenox)  no  If yes we need the name, milligram, dosage and who is prescribing doctor   Current Outpatient Medications  Medication Sig Dispense Refill   amLODipine (NORVASC) 2.5 MG tablet Take 2.5 mg by mouth daily.     atorvastatin (LIPITOR) 40 MG tablet Take 40 mg by mouth daily.     citalopram  (CELEXA ) 40 MG tablet Take 40 mg by mouth every morning.     clonazePAM (KLONOPIN) 1 MG tablet Take 0.5 mg by mouth 2 (two) times daily as needed.     colestipol  (COLESTID ) 1 g tablet TAKE 2 TABLETS BY MOUTH TWICE DAILY 360 tablet 0   gabapentin (NEURONTIN) 300 MG capsule TAKE ONE (1) CAPSULE BY MOUTH (3) TIMES DAILY     glipiZIDE (GLUCOTROL XL) 10 MG 24 hr tablet Take 10 mg by mouth daily with breakfast. (Patient taking differently: Take 5 mg by mouth  daily with breakfast.)     HYDROcodone -acetaminophen  (NORCO/VICODIN) 5-325 MG tablet Take 1 tablet by mouth every 6 (six) hours as needed.     METFORMIN HCL ER PO Take 1,000 mg by mouth in the morning and at bedtime.     tiZANidine (ZANAFLEX) 4 MG tablet Take 4 mg by mouth 2 (two) times daily as needed.     No current facility-administered medications for this visit.    Allergies  Allergen Reactions   Penicillins Rash    Pharmacy: [facility] or [facility]

## 2024-01-16 NOTE — Telephone Encounter (Signed)
Ok to schedule.  Room :Any   Thanks,  Michelle Lawman, MD Gastroenterology and Hepatology [facility] Gastroenterology

## 2024-01-23 NOTE — Telephone Encounter (Signed)
 Will need to await for Dr. Cinderella schedule

## 2024-02-06 MED ORDER — PEG 3350-KCL-NA BICARB-NACL 420 G PO SOLR: 4000.0000 mL | Freq: Once | ORAL | 0 refills | Status: AC

## 2024-02-06 NOTE — Telephone Encounter (Signed)
 Spoke with patient, scheduled TCS for 02/26/2024 at 9:45am. Rx sent to pharmacy. Instructions mailed.

## 2024-02-06 NOTE — Addendum Note (Signed)
 Addended by: DALLIE LIONEL RAMAN on: 02/06/2024 09:54 AM   Modules accepted: Orders

## 2024-02-06 NOTE — Telephone Encounter (Signed)
 Prior shara is not required

## 2024-02-06 NOTE — Telephone Encounter (Signed)
 Questionnaire from recall, no referral needed

## 2024-02-22 ENCOUNTER — Encounter (HOSPITAL_COMMUNITY)
Admission: RE | Admit: 2024-02-22 | Discharge: 2024-02-22 | Disposition: A | Discharge status: Home / Self Care | Source: Ambulatory Visit | Attending: Gastroenterology | Admitting: Gastroenterology

## 2024-02-22 ENCOUNTER — Other Ambulatory Visit: Payer: Self-pay

## 2024-02-22 ENCOUNTER — Encounter (HOSPITAL_COMMUNITY): Payer: Self-pay

## 2024-02-23 ENCOUNTER — Telehealth (INDEPENDENT_AMBULATORY_CARE_PROVIDER_SITE_OTHER): Payer: Self-pay

## 2024-02-23 NOTE — Telephone Encounter (Signed)
 Spoke with patient, she is not cancelling her procedure as of right now but I gave her the number to call just in case we do get snow and she is not able to make it.

## 2024-02-26 ENCOUNTER — Encounter (HOSPITAL_COMMUNITY): Admission: RE | Payer: Self-pay | Source: Home / Self Care

## 2024-02-26 ENCOUNTER — Ambulatory Visit (HOSPITAL_COMMUNITY): Admission: RE | Admit: 2024-02-26 | Admitting: Gastroenterology
